# Patient Record
Sex: Female | Born: 1975 | Race: White | Hispanic: No | Marital: Married | Smoking: Never smoker
Health system: Southern US, Community
[De-identification: ages and names within clinical notes are randomized; demographics above are authoritative.]

## PROBLEM LIST (undated history)

## (undated) DIAGNOSIS — N912 Amenorrhea, unspecified: Secondary | ICD-10-CM

## (undated) HISTORY — DX: Amenorrhea, unspecified: N91.2

---

## 2006-12-14 HISTORY — PX: DILATION AND CURETTAGE OF UTERUS: SHX78

## 2013-11-02 DIAGNOSIS — F909 Attention-deficit hyperactivity disorder, unspecified type: Secondary | ICD-10-CM | POA: Insufficient documentation

## 2017-10-05 DIAGNOSIS — Z23 Encounter for immunization: Secondary | ICD-10-CM | POA: Diagnosis not present

## 2017-11-26 ENCOUNTER — Ambulatory Visit: Payer: Self-pay

## 2017-11-26 ENCOUNTER — Ambulatory Visit (INDEPENDENT_AMBULATORY_CARE_PROVIDER_SITE_OTHER): Payer: 59 | Admitting: Sports Medicine

## 2017-11-26 ENCOUNTER — Encounter: Payer: Self-pay | Admitting: Sports Medicine

## 2017-11-26 VITALS — BP 118/78 | HR 83 | Ht 64.0 in | Wt 116.8 lb

## 2017-11-26 DIAGNOSIS — M75101 Unspecified rotator cuff tear or rupture of right shoulder, not specified as traumatic: Secondary | ICD-10-CM

## 2017-11-26 DIAGNOSIS — M25511 Pain in right shoulder: Secondary | ICD-10-CM | POA: Diagnosis not present

## 2017-11-26 MED ORDER — NITROGLYCERIN 0.2 MG/HR TD PT24: MEDICATED_PATCH | TRANSDERMAL | 1 refills | Status: DC

## 2017-11-26 NOTE — Procedures (Signed)
PROCEDURE NOTE: THERAPEUTIC EXERCISES (97110) 15 minutes spent for Therapeutic exercises as below and as referenced in the AVS. This included exercises focusing on stretching, strengthening, with significant focus on eccentric aspects.  Proper technique shown and discussed handout in great detail with ATC. All questions were discussed and answered.   Long term goals include an improvement in range of motion, strength, endurance as well as avoiding reinjury. Frequency of visits is one time as determined during today's  office visit. Frequency of exercises to be performed is as per handout.  EXERCISES REVIEWED:  Scapular stabilization  Intrinsic rotator cuff strengthening  bicep curls

## 2017-11-26 NOTE — Progress Notes (Signed)
Veverly FellsMichael D. Delorise Shinerigby, DO  [place] [facility] [facility] [phone]  Blima LedgerGenelle Kidney - 41 y.o. female MRN [id number]  Date of birth: 18-Jun-1976  Visit Date: 11/26/2017  PCP: No primary care provider on file.   Referred by: No ref. provider found   Scribe for today's visit: Christoper FabianMolly Weber, ATC    SUBJECTIVE:  Angelica Lee is here for New Patient (Initial Visit) (R shoulder pain) .   Her R shoulder pain symptoms INITIALLY: Began 4-5 months ago when she was lifting her work bag from the front seat and trying to move it to the back seat. Described as a stabbing 8/10 at it's worst and an aching 1/10 at rest. , nonradiating Worsened with throwing motions and R shoulder aBd and ER Improved: nothing has really improved her symptoms Additional associated symptoms include: no radiating pain into R arm and no N/T    At this time symptoms are worsening compared to onset. She has been using ice and has taken Advil intermittently.   ROS Denies night time disturbances. Denies fevers, chills, or night sweats. Denies unexplained weight loss. Denies personal history of cancer. Denies changes in bowel or bladder habits. Denies recent unreported falls. Denies new or worsening dyspnea or wheezing. Denies headaches or dizziness.  Reports numbness, tingling or weakness  In the extremities. (B index fingers and sometimes big toes) Denies dizziness or presyncopal episodes Denies lower extremity edema     HISTORY & PERTINENT PRIOR DATA:  Prior History reviewed and updated per electronic medical record.  Significant history, findings, studies and interim changes include:  has no tobacco history on file. No results for input(s): HGBA1C, LABURIC, CREATINE in the last 8760 hours. No specialty comments available. Problem  Rotator Cuff Syndrome of Right Shoulder     OBJECTIVE:  VS:  HT:5\' 4"  (162.6 cm)   WT:116 lb 12.8 oz (53 kg)  BMI:20.04    BP:118/78  HR:83bpm   TEMP: ( )  RESP:99 %  PHYSICAL EXAM: Constitutional: WDWN, Non-toxic appearing. Psychiatric: Alert & appropriately interactive. Not depressed or anxious appearing. Respiratory: No increased work of breathing. Trachea Midline Eyes: Pupils are equal. EOM intact without nystagmus. No scleral icterus Cardiovascular:  Peripheral Pulses: peripheral pulses symmetrical No clubbing or cyanosis appreciated Capillary Refill is normal, less than 2 seconds No signficant generalized edema/anasarca Sensory Exam: intact to light touch  Right Shoulder Exam: Normal alignment & Contours Skin: No overlying erythema/ecchymosis Neck: normal range of motion and supple Axial loading and circumduction produces: No pain, very mild crepitation Non tender to palpation over: Bony Landmarks TTP over: none Drop arm test: negative Hawkins: positive, mild pain  Neers: normal, no pain   Internal Rotation:  ROM: Normal with no pain Strength: Normal External Rotation:  ROM: Normal with no pain Strength: Normal  Empty can: positive, mild pain Strength: 4/5 Speeds: normal, no pain Strength: Normal O'Briens: normal, no pain Strength: Normal   ASSESSMENT & PLAN:   1. Acute pain of right shoulder   2. Rotator cuff syndrome of right shoulder    PLAN:   Rotator cuff syndrome of right shoulder Fairly classic mechanism for supraspinatus tendinopathy.  Chronically thickened on ultrasound Therapeutic exercises reviewed per procedure note Avoidance of exacerbating activities recommended Nitroglycerin protocol reviewed in detail.  If any lack of improvement can consider subacromial injection but given the overall stability and minimal symptoms when comparing nitroglycerin to corticosteroids 1 week pain relief is the same and promoting healing is preferable.  No  contraindications, cautioned on skin and headache issues.  Repeat MSK ultrasound at 6 weeks   ++++++++++++++++++++++++++++++++++++++++++++ Orders &  Meds: Orders Placed This Encounter  Procedures  . Misc procedure  . US LIMITED JOINT SPACE STRUCTURES UP RIGHT(NO LINKED CHARGES)    Meds ordered this encounter  Medications  . nitroGLYCERIN (NITRODUR - DOSED IN MG/24 HR) 0.2 mg/hr patch    Sig: Place 1/4 to 1/2 of a patch over affected region. Remove and replace once daily.  Slightly alter skin placement daily    Dispense:  30 patch    Refill:  1    For musculoskeletal purposes.  Okay to cut patch.    ++++++++++++++++++++++++++++++++++++++++++++ Follow-up: No Follow-up on file.   Pertinent documentation may be included in additional procedure notes, imaging studies, problem based documentation and patient instructions. Please see these sections of the encounter for additional information regarding this visit. CMA/ATC served as Neurosurgeonscribe during this visit. History, Physical, and Plan performed by medical provider. Documentation and orders reviewed and attested to.      Andrena MewsMichael D Daymion Nazaire, DO    [place] [facility] Physician

## 2017-11-26 NOTE — Assessment & Plan Note (Signed)
Fairly classic mechanism for supraspinatus tendinopathy.  Chronically thickened on ultrasound Therapeutic exercises reviewed per procedure note Avoidance of exacerbating activities recommended Nitroglycerin protocol reviewed in detail.  If any lack of improvement can consider subacromial injection but given the overall stability and minimal symptoms when comparing nitroglycerin to corticosteroids 1 week pain relief is the same and promoting healing is preferable.  No contraindications, cautioned on skin and headache issues.  Repeat MSK ultrasound at 6 weeks

## 2017-11-26 NOTE — Procedures (Signed)
LIMITED MSK ULTRASOUND OF right shoulder Images were obtained and interpreted by myself, Gaspar BiddingMichael Taylen Osorto, DO  Images have been saved and stored to PACS system. Images obtained on: GE S7 Ultrasound machine  FINDINGS:  Biceps Tendon: Intact with a small halo sign Pec Major Insertion: Normal Subscapularis Tendon: Normal Supraspinatus Tendon: Chronically thickened interstitial splitting with mild surrounding neovascularity but this is minimal.  Diminutive bursal fluid Infraspinatus/Teres Minor Tendon: Normal AC Joint: Normal JOINT: No significant GH spurring appreciated  IMPRESSION:  1. Supraspinatus tendinopathy without overt tearing

## 2017-11-26 NOTE — Patient Instructions (Addendum)
Please perform the exercise program that we have prepared for you and gone over in detail on a daily basis.  In addition to the handout you were provided you can access your program through: [email or web address]   Your unique program code is: [email or web address]   Nitroglycerin Protocol   Apply 1/4 nitroglycerin patch to affected area daily.  Change position of patch within the affected area every 24 hours.  You may experience a headache during the first 1-2 weeks of using the patch, these should subside.  If you experience headaches after beginning nitroglycerin patch treatment, you may take your preferred over the counter pain reliever.  Another side effect of the nitroglycerin patch is skin irritation or rash related to patch adhesive.  Please notify our office if you develop more severe headaches or rash, and stop the patch.  Tendon healing with nitroglycerin patch may require 12 to 24 weeks depending on the extent of injury.  Men should not use if taking Viagra, Cialis, or Levitra.   Do not use if you have migraines or rosacea.

## 2018-01-07 ENCOUNTER — Encounter: Payer: Self-pay | Admitting: Sports Medicine

## 2018-01-07 ENCOUNTER — Ambulatory Visit: Payer: Self-pay

## 2018-01-07 ENCOUNTER — Ambulatory Visit (INDEPENDENT_AMBULATORY_CARE_PROVIDER_SITE_OTHER): Payer: 59 | Admitting: Sports Medicine

## 2018-01-07 VITALS — BP 112/68 | HR 74 | Ht 64.0 in | Wt 119.6 lb

## 2018-01-07 DIAGNOSIS — M75101 Unspecified rotator cuff tear or rupture of right shoulder, not specified as traumatic: Secondary | ICD-10-CM

## 2018-01-07 DIAGNOSIS — M9902 Segmental and somatic dysfunction of thoracic region: Secondary | ICD-10-CM | POA: Diagnosis not present

## 2018-01-07 DIAGNOSIS — M9901 Segmental and somatic dysfunction of cervical region: Secondary | ICD-10-CM

## 2018-01-07 NOTE — Procedures (Signed)
LIMITED MSK ULTRASOUND OF right shoulder Images were obtained and interpreted by myself, Gaspar BiddingMichael Celisa Schoenberg, DO  Images have been saved and stored to PACS system. Images obtained on: GE S7 Ultrasound machine  FINDINGS:  Biceps Tendon: Normal Pec Major Insertion: Normal Subscapularis Tendon: Small amount of interstitial splitting but minimal. Supraspinatus Tendon: Slightly diminutive given overlying deltoid.  She has interstitial splitting with minimal neovascularity. Infraspinatus/Teres Minor Tendon: Normal AC Joint: Normal JOINT: No significant GH spurring appreciated  LABRUM: Not evaluated    IMPRESSION:  1. Supraspinatus rotator cuff tendinopathy without overt tearing.  Minimal neovascularity.

## 2018-01-07 NOTE — Progress Notes (Signed)
Angelica FellsMichael D. Angelica Shinerigby, DO  Angelica Lee [phone]  Angelica LedgerGenelle Lee - 42 y.o. female MRN [id number]  Date of birth: [birth year]  Visit Date: 01/07/2018  PCP: Angelica Lee, Angelica Lee D, DO   Referred by: No ref. provider found   Scribe for today's visit: Angelica ClinchBrandy Lee, CMA     SUBJECTIVE:  Angelica Lee is here for Follow-up (RT shoulder pain)  Her R shoulder pain symptoms INITIALLY: Began 4-5 months ago when she was lifting her work bag from the front seat and trying to move it to the back seat. Described as a stabbing 8/10 at it's worst and an aching 1/10 at rest. , nonradiating Worsened with throwing motions and R shoulder aBd and ER Improved: nothing has really improved her symptoms Additional associated symptoms include: no radiating pain into R arm and no N/T     Compared to the last office visit, her previously described symptoms are improving, over the past few days the pain has not been waking her from sleep.  Current symptoms are mild at rest but moderate when she "pushes it".  Pain is non-radiating. She has been following Nitro Protocol, and doing home exercises. Prior she had been taking Advil and icing shoulder prn.    ROS Denies night time disturbances. Denies fevers, chills, or night sweats. Denies unexplained weight loss. Denies personal history of cancer. Denies changes in bowel or bladder habits. Denies recent unreported falls. Denies new or worsening dyspnea or wheezing. Denies headaches or dizziness.  Reports numbness, tingling or weakness  In the extremities.  Denies dizziness or presyncopal episodes Denies lower extremity edema     HISTORY & PERTINENT PRIOR DATA:  Prior History reviewed and updated per electronic medical record.  Significant history, findings, studies and interim changes include:  reports that  has never smoked. she has never used smokeless tobacco. No results for input(s): HGBA1C, LABURIC, CREATINE  in the last 8760 hours. No specialty comments available. Problem  Rotator Cuff Syndrome of Right Shoulder    OBJECTIVE:  VS:  HT:5\' 4"  (162.6 cm)   WT:119 lb 9.6 oz (54.3 kg)  BMI:20.52    BP:112/68  HR:74bpm  TEMP: ( )  RESP:99 %   PHYSICAL EXAM: Constitutional: WDWN, Non-toxic appearing. Psychiatric: Alert & appropriately interactive.  Not depressed or anxious appearing. Respiratory: No increased work of breathing.  Trachea Midline Eyes: Pupils are equal.  EOM intact without nystagmus.  No scleral icterus  NEUROVASCULAR exam: No clubbing or cyanosis appreciated No significant venous stasis changes Capillary Refill: normal, less than 2 seconds   Right Shoulder Exam: Normal alignment & Contours Skin: No overlying erythema/ecchymosis Neck: normal range of motion and supple Axial loading and circumduction produces: No pain or crepitation  Non tender to palpation over: Bony Landmarks TTP over: clavicle Drop arm test: negative Hawkins: normal, no pain  Neers: normal, no pain   Internal Rotation:  ROM: Normal with mild pain Strength: Normal External Rotation:  ROM: Normal with no pain Strength: Normal  Empty can: positive, mild pain Strength: Normal Speeds: positive, mild pain Strength: Normal O'Briens: normal, no pain Strength: Normal    ASSESSMENT & PLAN:   1. Rotator cuff syndrome of right shoulder   2. Somatic dysfunction of cervical region   3. Somatic dysfunction of thoracic region    ++++++++++++++++++++++++++++++++++++++++++++ Orders & Meds:  Orders Placed This Encounter  Procedures  . OSTEOPATHIC MANIPULATION TREATMENT  . US LIMITED JOINT SPACE STRUCTURES UP RIGHT(NO LINKED CHARGES)  No orders of the defined types were placed in this encounter.   ++++++++++++++++++++++++++++++++++++++++++++ PLAN:    Rotator cuff syndrome of right shoulder Rotator cuff tendinopathy with underlying scapular dyskinesis and interstitial changes of the supraspinatus.   We will plan to have her begin on home therapeutic exercises and these were reviewed in detail with the athletic training staff. Therapeutic exercises per procedure note Discussed that the anticipated amount of time for healing is 12- 24 weeks for Tendinopathic changes.  Emphasized the importance of improving blood flow as well as eccentric loading of the tendon.  We will plan to check in in 6 weeks and repeat ultrasound at that time.   We did discuss that further diagnostic manipulative medicine could be beneficial and healing and chiropractic was offered versus repeat manipulation with myself.    Follow-up: Return in about 6 weeks (around 02/18/2018).   Pertinent documentation may be included in additional procedure notes, imaging studies, problem based documentation and patient instructions. Please see these sections of the encounter for additional information regarding this visit. CMA/ATC served as Neurosurgeon during this visit. History, Physical, and Plan performed by medical provider. Documentation and orders reviewed and attested to.      Angelica Mews, DO    [facility]

## 2018-01-07 NOTE — Patient Instructions (Signed)
Damien Rudolfo at [facility] is who I would recommend you seeing for your neck and upper back.  [phone]  Also check out "[employer]" which is a program developed by Dr. Myles LippsEric Goodman.   There are links to a couple of his YouTube Videos below and I would like to see performing one of his videos 5-6 days per week.    A good intro video is: "Independence from Pain 7-minute Video" - [email or web address]   His more advanced video is: "Powerful Posture and Pain Relief: 12 minutes of Foundation Training" - https://[email or web address].be/[email or web address]  Do not try to attempt this entire video when first beginning.    Try breaking of each exercise that he goes into shorter segments.  Otherwise if they perform an exercise for 45 seconds, start with 15 seconds and rest and then resume when they begin the new activity.    If you work your way up to doing this 12 minute video, I expect you will see significant improvements in your pain.  If you enjoy his videos and would like to find out more you can look on his website: [email or web address].  He has a workout streaming option as well as a DVD set available for purchase.  Amazon has the best price for his DVDs.

## 2018-02-07 ENCOUNTER — Encounter: Payer: Self-pay | Admitting: Sports Medicine

## 2018-02-08 ENCOUNTER — Encounter: Payer: Self-pay | Admitting: Sports Medicine

## 2018-02-08 NOTE — Assessment & Plan Note (Signed)
Rotator cuff tendinopathy with underlying scapular dyskinesis and interstitial changes of the supraspinatus.  We will plan to have her begin on home therapeutic exercises and these were reviewed in detail with the athletic training staff. Therapeutic exercises per procedure note Discussed that the anticipated amount of time for healing is 12- 24 weeks for Tendinopathic changes.  Emphasized the importance of improving blood flow as well as eccentric loading of the tendon.  We will plan to check in in 6 weeks and repeat ultrasound at that time.   We did discuss that further diagnostic manipulative medicine could be beneficial and healing and chiropractic was offered versus repeat manipulation with myself.

## 2018-02-08 NOTE — Procedures (Signed)
PROCEDURE NOTE: THERAPEUTIC EXERCISES (97110) 15 minutes spent for Therapeutic exercises as below and as referenced in the AVS. This included exercises focusing on stretching, strengthening, with significant focus on eccentric aspects.  Proper technique shown and discussed handout in great detail with ATC. All questions were discussed and answered.   Long term goals include an improvement in range of motion, strength, endurance as well as avoiding reinjury. Frequency of visits is one time as determined during today's  office visit. Frequency of exercises to be performed is as per handout.  EXERCISES REVIEWED:  Scapular stabilization  Intrinsic rotator cuff strengthening  

## 2018-02-08 NOTE — Procedures (Signed)
PROCEDURE NOTE : OSTEOPATHIC MANIPULATION The decision today to treat with Osteopathic Manipulative Therapy (OMT) was based on physical exam findings. Verbal consent was obtained following a discussion with the patient regarding the of risks, benefits and potential side effects, including an acute pain flare,post manipulation soreness and need for repeat treatments. Additionally, we specifically discussed the minimal risk of  injury to neurovascular structures associated with Cervical manipulation.   Contraindications to OMT reviewed and include: NONE  Manipulation was performed as below: Regions Treated: cervial spine and lumbar spine Techniques used: HVLA, muscle energy, myofascial release and Articulatory The patient tolerated the treatment well and reported Improved symptoms following treatment today. Patient was given medications, exercises, stretches and lifestyle modifications per AVS and verbally.     OSTEOPATHIC/STRUCTURAL EXAM FINDINGS:    AA rotated right  C2 FRS left  C6 FRS left  T2 through T6 neutral rotated left, side bent right

## 2018-02-18 ENCOUNTER — Encounter: Payer: Self-pay | Admitting: Sports Medicine

## 2018-02-18 ENCOUNTER — Ambulatory Visit: Payer: Self-pay

## 2018-02-18 ENCOUNTER — Ambulatory Visit (INDEPENDENT_AMBULATORY_CARE_PROVIDER_SITE_OTHER): Payer: 59 | Admitting: Sports Medicine

## 2018-02-18 VITALS — BP 108/70 | HR 91 | Ht 64.0 in | Wt 118.2 lb

## 2018-02-18 DIAGNOSIS — M9902 Segmental and somatic dysfunction of thoracic region: Secondary | ICD-10-CM

## 2018-02-18 DIAGNOSIS — M75101 Unspecified rotator cuff tear or rupture of right shoulder, not specified as traumatic: Secondary | ICD-10-CM

## 2018-02-18 DIAGNOSIS — M9901 Segmental and somatic dysfunction of cervical region: Secondary | ICD-10-CM

## 2018-02-18 NOTE — Patient Instructions (Addendum)
Reminder to discuss piezowave therapy w/ Dr. Sherryle Lisodulfo.

## 2018-02-18 NOTE — Progress Notes (Signed)
Angelica Lee. [facility] [phone]  Angelica Lee - 42 y.o. female MRN [id number]  Date of birth: Sep 30, 1976  Visit Date: 02/18/2018  PCP: Andrena Mews, DO   Referred by: Andrena Mews, DO   Scribe for today's visit: Stevenson Clinch, CMA     SUBJECTIVE:  Angelica Lee is here for Follow-up (neck and R shoulder pain)  11/26/17: Her R shoulder pain symptoms INITIALLY: Began 4-5 months ago when she was lifting her work bag from the front seat and trying to move it to the back seat. Described as a stabbing 8/10 at it's worst and an aching 1/10 at rest. , nonradiating Worsened with throwing motions and R shoulder aBd and ER Improved: nothing has really improved her symptoms Additional associated symptoms include: no radiating pain into R arm and no N/T At this time symptoms are worsening compared to onset. She has been using ice and has taken Advil intermittently.  02/18/18: Compared to the last office visit, her previously described symptoms are improving, shoulder pain has improved but she continues to have neck pain.  Current symptoms are mild & are nonradiating She has been following Nitro Protocol with no side effects, doing HEP and goodman exercises occasionally with no trouble. She has done OMT in the past and was referred to Dr. Lambert Mody at [facility].    ROS Denies night time disturbances. Denies fevers, chills, or night sweats. Denies unexplained weight loss. Denies personal history of cancer. Denies changes in bowel or bladder habits. Denies recent unreported falls. Denies new or worsening dyspnea or wheezing. Reports headaches.  Denies numbness, tingling or weakness  In the extremities.  Denies dizziness or presyncopal episodes Denies lower extremity edema   .bsm1history  OBJECTIVE:  VS:  HT:5\' 4"  (162.6 cm)   WT:118 lb 3.2 oz (53.6 kg)  BMI:20.28    BP:108/70  HR:91bpm  TEMP: ( )  RESP:99  %   PHYSICAL EXAM: Constitutional: WDWN, Non-toxic appearing. Psychiatric: Alert & appropriately interactive.  Not depressed or anxious appearing. Respiratory: No increased work of breathing.  Trachea Midline Eyes: Pupils are equal.  EOM intact without nystagmus.  No scleral icterus  NEUROVASCULAR exam: No clubbing or cyanosis appreciated No significant venous stasis changes Capillary Refill: normal, less than 2 seconds   Right shoulder with full overhead range of motion.  She has an elevated right scapula.  Slightly protracted.  Small amount of pain with cross body testing but this is minimal.  Otherwise rotator cuff strength is intact with no significant pain.  Negative speeds test negative O'Brien's test.   ASSESSMENT & PLAN:   1. Rotator cuff syndrome of right shoulder   2. Somatic dysfunction of cervical region   3. Somatic dysfunction of thoracic region    ++++++++++++++++++++++++++++++++++++++++++++ Orders & Meds:  Orders Placed This Encounter  Procedures  . [place] MSK POCT ULTRASOUND   No orders of the defined types were placed in this encounter.   ++++++++++++++++++++++++++++++++++++++++++++ PLAN:    Rotator cuff syndrome of right shoulder She is doing significantly better.  I did reemphasize the importance of continuing with home therapeutic exercises.  She does have continued scapular thoracic dysfunction and would benefit from ongoing exercises as well as ongoing structural evaluation.  Given the small amount of persistent change within the supraspinatus tendon Piezowave Therapy could be considered with chiropractic treatments.  Repeat osteopathic manipulation performed today.  Continue with topical nitroglycerin until she is completed the current  prescription but if symptoms have resolved by that time no further refills needed.  If any small return of symptoms can refill this however if needed.   Follow-up: Return if symptoms worsen or fail to improve.   Pertinent  documentation may be included in additional procedure notes, imaging studies, problem based documentation and patient instructions. Please see these sections of the encounter for additional information regarding this visit. CMA/ATC served as Neurosurgeonscribe during this visit. History, Physical, and Plan performed by medical provider. Documentation and orders reviewed and attested to.      Andrena MewsMichael D Rigby, DO    [place] [facility] Physician

## 2018-02-18 NOTE — Procedures (Signed)
PROCEDURE NOTE : OSTEOPATHIC MANIPULATION The decision today to treat with Osteopathic Manipulative Therapy (OMT) was based on physical exam findings. Verbal consent was obtained following a discussion with the patient regarding the of risks, benefits and potential side effects, including an acute pain flare,post manipulation soreness and need for repeat treatments. Additionally, we specifically discussed the minimal risk of  injury to neurovascular structures associated with Cervical manipulation.   Contraindications to OMT reviewed and include: NONE  Manipulation was performed as below: Regions Treated: cervial spine, thoracic spine and Ribs Techniques used: HVLA, muscle energy, myofascial release and Articulatory   The patient tolerated the treatment well and reported Improved symptoms following treatment today. Patient was given medications, exercises, stretches and lifestyle modifications per AVS and verbally.     OSTEOPATHIC/STRUCTURAL EXAM FINDINGS:   Sup. Scapula:  right Restricted Shoulder:  right  SOMATIC DYSFUNCTION         Cervical: C2, Flexion, Side bend: right and Rotation: right  C4, Flexion, Side bend: left and Rotation: left  C6, Extension, Side bend: left and Rotation: left        Thoracic: T2, Flexion, Side bend: right and Rotation: right

## 2018-02-20 NOTE — Assessment & Plan Note (Signed)
She is doing significantly better.  I did reemphasize the importance of continuing with home therapeutic exercises.  She does have continued scapular thoracic dysfunction and would benefit from ongoing exercises as well as ongoing structural evaluation.  Given the small amount of persistent change within the supraspinatus tendon Piezowave Therapy could be considered with chiropractic treatments.  Repeat osteopathic manipulation performed today.  Continue with topical nitroglycerin until she is completed the current prescription but if symptoms have resolved by that time no further refills needed.  If any small return of symptoms can refill this however if needed.

## 2018-02-20 NOTE — Procedures (Signed)
LIMITED MSK ULTRASOUND OF RIGHT SHOULDER Images were obtained and interpreted by myself, Gaspar BiddingMichael Rigby, DO  Images have been saved and stored to PACS system. Images obtained on: GE S7 Ultrasound machine  FINDINGS:  Biceps Tendon: Normal Pec Major Insertion: Normal Subscapularis Tendon: Normal Supraspinatus Tendon: Slight interstitial splitting however significant improvement in the interstitial fibrillar structure.  There is mild increased neovascularity as expected.  Small subdeltoid bursa but minimal.  No overt tearing appreciated. Infraspinatus/Teres Minor Tendon: Normal AC Joint: Normal JOINT: No significant GH spurring appreciated  LABRUM: Not evaluated   IMPRESSION:  1. Improving supraspinatus tendinopathy

## 2018-02-22 DIAGNOSIS — M9901 Segmental and somatic dysfunction of cervical region: Secondary | ICD-10-CM | POA: Diagnosis not present

## 2018-02-22 DIAGNOSIS — M9902 Segmental and somatic dysfunction of thoracic region: Secondary | ICD-10-CM | POA: Diagnosis not present

## 2018-02-22 DIAGNOSIS — M9903 Segmental and somatic dysfunction of lumbar region: Secondary | ICD-10-CM | POA: Diagnosis not present

## 2018-02-24 DIAGNOSIS — M9902 Segmental and somatic dysfunction of thoracic region: Secondary | ICD-10-CM | POA: Diagnosis not present

## 2018-02-24 DIAGNOSIS — M9903 Segmental and somatic dysfunction of lumbar region: Secondary | ICD-10-CM | POA: Diagnosis not present

## 2018-02-24 DIAGNOSIS — M9901 Segmental and somatic dysfunction of cervical region: Secondary | ICD-10-CM | POA: Diagnosis not present

## 2018-03-01 DIAGNOSIS — M9903 Segmental and somatic dysfunction of lumbar region: Secondary | ICD-10-CM | POA: Diagnosis not present

## 2018-03-01 DIAGNOSIS — M9902 Segmental and somatic dysfunction of thoracic region: Secondary | ICD-10-CM | POA: Diagnosis not present

## 2018-03-01 DIAGNOSIS — M9901 Segmental and somatic dysfunction of cervical region: Secondary | ICD-10-CM | POA: Diagnosis not present

## 2018-03-04 DIAGNOSIS — M9903 Segmental and somatic dysfunction of lumbar region: Secondary | ICD-10-CM | POA: Diagnosis not present

## 2018-03-04 DIAGNOSIS — M9901 Segmental and somatic dysfunction of cervical region: Secondary | ICD-10-CM | POA: Diagnosis not present

## 2018-03-04 DIAGNOSIS — M9902 Segmental and somatic dysfunction of thoracic region: Secondary | ICD-10-CM | POA: Diagnosis not present

## 2018-03-08 DIAGNOSIS — M9902 Segmental and somatic dysfunction of thoracic region: Secondary | ICD-10-CM | POA: Diagnosis not present

## 2018-03-08 DIAGNOSIS — M9903 Segmental and somatic dysfunction of lumbar region: Secondary | ICD-10-CM | POA: Diagnosis not present

## 2018-03-08 DIAGNOSIS — M9901 Segmental and somatic dysfunction of cervical region: Secondary | ICD-10-CM | POA: Diagnosis not present

## 2018-03-11 DIAGNOSIS — M9901 Segmental and somatic dysfunction of cervical region: Secondary | ICD-10-CM | POA: Diagnosis not present

## 2018-03-11 DIAGNOSIS — M9902 Segmental and somatic dysfunction of thoracic region: Secondary | ICD-10-CM | POA: Diagnosis not present

## 2018-03-11 DIAGNOSIS — M9903 Segmental and somatic dysfunction of lumbar region: Secondary | ICD-10-CM | POA: Diagnosis not present

## 2018-03-15 DIAGNOSIS — M9903 Segmental and somatic dysfunction of lumbar region: Secondary | ICD-10-CM | POA: Diagnosis not present

## 2018-03-15 DIAGNOSIS — M9902 Segmental and somatic dysfunction of thoracic region: Secondary | ICD-10-CM | POA: Diagnosis not present

## 2018-03-15 DIAGNOSIS — M9901 Segmental and somatic dysfunction of cervical region: Secondary | ICD-10-CM | POA: Diagnosis not present

## 2018-03-29 DIAGNOSIS — M9901 Segmental and somatic dysfunction of cervical region: Secondary | ICD-10-CM | POA: Diagnosis not present

## 2018-03-29 DIAGNOSIS — M9903 Segmental and somatic dysfunction of lumbar region: Secondary | ICD-10-CM | POA: Diagnosis not present

## 2018-03-29 DIAGNOSIS — M9902 Segmental and somatic dysfunction of thoracic region: Secondary | ICD-10-CM | POA: Diagnosis not present

## 2018-04-05 DIAGNOSIS — M9902 Segmental and somatic dysfunction of thoracic region: Secondary | ICD-10-CM | POA: Diagnosis not present

## 2018-04-05 DIAGNOSIS — M9901 Segmental and somatic dysfunction of cervical region: Secondary | ICD-10-CM | POA: Diagnosis not present

## 2018-04-05 DIAGNOSIS — M9903 Segmental and somatic dysfunction of lumbar region: Secondary | ICD-10-CM | POA: Diagnosis not present

## 2018-04-14 DIAGNOSIS — M9902 Segmental and somatic dysfunction of thoracic region: Secondary | ICD-10-CM | POA: Diagnosis not present

## 2018-04-14 DIAGNOSIS — M9901 Segmental and somatic dysfunction of cervical region: Secondary | ICD-10-CM | POA: Diagnosis not present

## 2018-04-14 DIAGNOSIS — M9903 Segmental and somatic dysfunction of lumbar region: Secondary | ICD-10-CM | POA: Diagnosis not present

## 2018-04-28 DIAGNOSIS — M9901 Segmental and somatic dysfunction of cervical region: Secondary | ICD-10-CM | POA: Diagnosis not present

## 2018-04-28 DIAGNOSIS — M9902 Segmental and somatic dysfunction of thoracic region: Secondary | ICD-10-CM | POA: Diagnosis not present

## 2018-04-28 DIAGNOSIS — M9903 Segmental and somatic dysfunction of lumbar region: Secondary | ICD-10-CM | POA: Diagnosis not present

## 2018-05-19 DIAGNOSIS — M9903 Segmental and somatic dysfunction of lumbar region: Secondary | ICD-10-CM | POA: Diagnosis not present

## 2018-05-19 DIAGNOSIS — M9902 Segmental and somatic dysfunction of thoracic region: Secondary | ICD-10-CM | POA: Diagnosis not present

## 2018-05-19 DIAGNOSIS — M9901 Segmental and somatic dysfunction of cervical region: Secondary | ICD-10-CM | POA: Diagnosis not present

## 2019-03-13 ENCOUNTER — Telehealth: Payer: Self-pay | Admitting: Family Medicine

## 2019-03-13 NOTE — Telephone Encounter (Signed)
Copied from CRM [id number]. Topic: Quick Communication - See Telephone Encounter >> Mar 13, 2019  4:57 PM Jens Som A wrote: CRM for notification. See Telephone encounter for: 03/13/19. Patient is calling back to reschedule new patient  appt with Dr. Earlene Plater [phone] [facility])  Email verified. Please advise.

## 2019-03-14 NOTE — Telephone Encounter (Signed)
Contact patient to schedule °

## 2019-03-14 NOTE — Telephone Encounter (Signed)
Sent to wrong office. Routing to [facility]

## 2019-03-15 ENCOUNTER — Encounter: Payer: Self-pay | Admitting: Family Medicine

## 2019-03-15 ENCOUNTER — Ambulatory Visit (INDEPENDENT_AMBULATORY_CARE_PROVIDER_SITE_OTHER): Payer: 59 | Admitting: Family Medicine

## 2019-03-15 ENCOUNTER — Other Ambulatory Visit: Payer: Self-pay

## 2019-03-15 VITALS — Temp 98.3°F | Ht 63.0 in | Wt 118.0 lb

## 2019-03-15 DIAGNOSIS — Z789 Other specified health status: Secondary | ICD-10-CM | POA: Diagnosis not present

## 2019-03-15 DIAGNOSIS — J302 Other seasonal allergic rhinitis: Secondary | ICD-10-CM | POA: Diagnosis not present

## 2019-03-15 DIAGNOSIS — E559 Vitamin D deficiency, unspecified: Secondary | ICD-10-CM | POA: Diagnosis not present

## 2019-03-15 DIAGNOSIS — F902 Attention-deficit hyperactivity disorder, combined type: Secondary | ICD-10-CM | POA: Diagnosis not present

## 2019-03-15 NOTE — Progress Notes (Signed)
Virtual Visit via Video   I connected with Angelica Lee on 03/17/19 at  2:20 PM EDT by a video enabled telemedicine application and verified that I am speaking with the correct person using two identifiers. Location patient: Home Location provider: [facility], Office Persons participating in the virtual visit: Angelica Lee, Helane Rima, DO Barnie Mort, CMA acting as scribe for Dr. Helane Rima.   I discussed the limitations of evaluation and management by telemedicine and the availability of in person appointments. The patient expressed understanding and agreed to proceed.  Subjective:   HPI:  ADHD: Patient has been treated from elementary school. She has tried several different medications. She like Vyvanse better but when her insurance changed and her co pay went up. She is currently on Adderall xr 20mg . She feels like it is working well and would like to stay on that.   She is working on diet and getting out side more. She is working from home and able to be outside more with kids so she is getting more exercise.     Reviewed all precautions and expectations with prevention of Covid-19 ROS: See pertinent positives and negatives per HPI.  Patient Active Problem List   Diagnosis Date Noted  . Rotator cuff syndrome of right shoulder 11/26/2017  . ADHD (attention deficit hyperactivity disorder) 11/02/2013    Social History   Tobacco Use  . Smoking status: Never Smoker  . Smokeless tobacco: Never Used  Substance Use Topics  . Alcohol use: Yes    Comment: 4-5 days a week glass of wine a night     Current Outpatient Medications:  .  amphetamine-dextroamphetamine (ADDERALL XR) 20 MG 24 hr capsule, Take 1 capsule (20 mg total) by mouth daily., Disp: 30 capsule, Rfl: 0 .  cetirizine (ZYRTEC) 10 MG tablet, Take 10 mg by mouth daily., Disp: , Rfl:  .  cholecalciferol (VITAMIN D3) 25 MCG (1000 UT) tablet, Take 1,000 Units by mouth daily., Disp: , Rfl:  .  norethindrone-ethinyl  estradiol (CYCLAFEM,ALYACEN) 0.5/0.75/1-35 MG-MCG tablet, Take 1 tablet by mouth daily., Disp: 3 Package, Rfl: 3 .  [START ON 04/14/2019] amphetamine-dextroamphetamine (ADDERALL XR) 20 MG 24 hr capsule, Take 1 capsule (20 mg total) by mouth daily., Disp: 30 capsule, Rfl: 0 .  [START ON 05/14/2019] amphetamine-dextroamphetamine (ADDERALL XR) 20 MG 24 hr capsule, Take 1 capsule (20 mg total) by mouth daily., Disp: 30 capsule, Rfl: 0  No Known Allergies  Objective:   VITALS: Per patient if applicable, see vitals. GENERAL: Alert, appears well and in no acute distress. HEENT: Atraumatic, conjunctiva clear, no obvious abnormalities on inspection of external nose and ears. NECK: Normal movements of the head and neck. CARDIOPULMONARY: No increased WOB. Speaking in clear sentences. I:E ratio WNL.  MS: Moves all visible extremities without noticeable abnormality. PSYCH: Pleasant and cooperative, well-groomed. Speech normal rate and rhythm. Affect is appropriate. Insight and judgement are appropriate. Attention is focused, linear, and appropriate.  NEURO: CN grossly intact. Oriented as arrived to appointment on time with no prompting. Moves both UE equally.  SKIN: No obvious lesions, wounds, erythema, or cyanosis noted on face or hands.  Assessment and Plan:   [name] was seen today for establish care.  Diagnoses and all orders for this visit:  Attention deficit hyperactivity disorder (ADHD), combined type -     amphetamine-dextroamphetamine (ADDERALL XR) 20 MG 24 hr capsule; Take 1 capsule (20 mg total) by mouth daily. -     amphetamine-dextroamphetamine (ADDERALL XR) 20 MG 24  hr capsule; Take 1 capsule (20 mg total) by mouth daily. -     amphetamine-dextroamphetamine (ADDERALL XR) 20 MG 24 hr capsule; Take 1 capsule (20 mg total) by mouth daily.  Seasonal allergies  Uses birth control -     norethindrone-ethinyl estradiol (CYCLAFEM,ALYACEN) 0.5/0.75/1-35 MG-MCG tablet; Take 1 tablet by mouth  daily.  Vitamin D deficiency    . Reviewed expectations re: course of current medical issues. . Discussed self-management of symptoms. . Outlined signs and symptoms indicating need for more acute intervention. . Patient verbalized understanding and all questions were answered. Marland Kitchen Health Maintenance issues including appropriate healthy diet, exercise, and smoking avoidance were discussed with patient. . See orders for this visit as documented in the electronic medical record.  Helane Rima, DO 03/17/2019

## 2019-03-16 ENCOUNTER — Encounter: Payer: Self-pay | Admitting: Family Medicine

## 2019-03-17 ENCOUNTER — Encounter: Payer: Self-pay | Admitting: Family Medicine

## 2019-03-17 MED ORDER — AMPHETAMINE-DEXTROAMPHET ER 20 MG PO CP24: 20.0000 mg | ORAL_CAPSULE | Freq: Every day | ORAL | 0 refills | Status: DC

## 2019-03-17 MED ORDER — NORETHIN-ETH ESTRAD TRIPHASIC 0.5/0.75/1-35 MG-MCG PO TABS: 1.0000 | ORAL_TABLET | Freq: Every day | ORAL | 3 refills | Status: DC

## 2019-03-17 NOTE — Telephone Encounter (Signed)
Printed and given to reception to update.

## 2019-06-07 ENCOUNTER — Ambulatory Visit: Payer: 59 | Admitting: Family Medicine

## 2019-06-27 ENCOUNTER — Other Ambulatory Visit: Payer: Self-pay | Admitting: Family Medicine

## 2019-06-27 DIAGNOSIS — F902 Attention-deficit hyperactivity disorder, combined type: Secondary | ICD-10-CM

## 2019-06-27 MED ORDER — AMPHETAMINE-DEXTROAMPHET ER 20 MG PO CP24: 20.0000 mg | ORAL_CAPSULE | Freq: Every day | ORAL | 0 refills | Status: DC

## 2019-06-27 NOTE — Telephone Encounter (Signed)
Rx request Last fill 04/0/20  #30/0 Last OV 03/15/19

## 2019-08-07 ENCOUNTER — Other Ambulatory Visit: Payer: Self-pay | Admitting: Family Medicine

## 2019-08-07 DIAGNOSIS — F902 Attention-deficit hyperactivity disorder, combined type: Secondary | ICD-10-CM

## 2019-08-07 MED ORDER — AMPHETAMINE-DEXTROAMPHET ER 20 MG PO CP24: 20.0000 mg | ORAL_CAPSULE | Freq: Every day | ORAL | 0 refills | Status: DC

## 2019-09-08 ENCOUNTER — Other Ambulatory Visit: Payer: Self-pay | Admitting: Family Medicine

## 2019-09-08 DIAGNOSIS — F902 Attention-deficit hyperactivity disorder, combined type: Secondary | ICD-10-CM

## 2019-09-08 MED ORDER — AMPHETAMINE-DEXTROAMPHET ER 20 MG PO CP24: 20.0000 mg | ORAL_CAPSULE | Freq: Every day | ORAL | 0 refills | Status: DC

## 2019-09-08 NOTE — Telephone Encounter (Signed)
Last OV 03/15/19 Last refill 08/07/19 #30/0 Next OV 09/18/19  Forwarding to Dr. Juleen China

## 2019-09-14 ENCOUNTER — Encounter: Payer: Self-pay | Admitting: Family Medicine

## 2019-09-17 NOTE — Progress Notes (Signed)
Subjective:    Angelica Lee is a 43 y.o. female and is here for a comprehensive physical exam.  There are no preventive care reminders to display for this patient.   Current Outpatient Medications:  .  amphetamine-dextroamphetamine (ADDERALL XR) 20 MG 24 hr capsule, Take 1 capsule (20 mg total) by mouth daily., Disp: 30 capsule, Rfl: 0 .  amphetamine-dextroamphetamine (ADDERALL XR) 20 MG 24 hr capsule, Take 1 capsule (20 mg total) by mouth daily., Disp: 30 capsule, Rfl: 0 .  amphetamine-dextroamphetamine (ADDERALL XR) 20 MG 24 hr capsule, Take 1 capsule (20 mg total) by mouth daily., Disp: 30 capsule, Rfl: 0 .  cetirizine (ZYRTEC) 10 MG tablet, Take 10 mg by mouth daily., Disp: , Rfl:  .  cholecalciferol (VITAMIN D3) 25 MCG (1000 UT) tablet, Take 1,000 Units by mouth daily., Disp: , Rfl:  .  fluticasone (FLONASE) 50 MCG/ACT nasal spray, Place 2 sprays into both nostrils daily., Disp: , Rfl:  .  norethindrone-ethinyl estradiol (CYCLAFEM,ALYACEN) 0.5/0.75/1-35 MG-MCG tablet, Take 1 tablet by mouth daily., Disp: 3 Package, Rfl: 3  PMHx, SurgHx, SocialHx, Medications, and Allergies were reviewed in the Visit Navigator and updated as appropriate.   History reviewed. No pertinent past medical history.   Past Surgical History:  Procedure Laterality Date  . DILATION AND CURETTAGE OF UTERUS  2008     Family History  Problem Relation Age of Onset  . Hypertension Mother   . Hyperlipidemia Mother   . Hypertension Father   . ADD / ADHD Father   . Diabetes Sister        she is twin   . Breast cancer Maternal Grandmother 79    Social History   Tobacco Use  . Smoking status: Never Smoker  . Smokeless tobacco: Never Used  Substance Use Topics  . Alcohol use: Yes    Comment: 4-5 days a week glass of wine a night   . Drug use: Never    Review of Systems:   Pertinent items are noted in the HPI. Otherwise, ROS is negative.  Objective:   Pulse 92   Temp 98.5 F (36.9 C)  (Temporal)   Ht 5\' 3"  (1.6 m)   Wt 119 lb 6.4 oz (54.2 kg)   SpO2 99%   BMI 21.15 kg/m   General appearance: alert, cooperative and appears stated age. Head: normocephalic, without obvious abnormality, atraumatic. Neck: no adenopathy, supple, symmetrical, trachea midline; thyroid not enlarged, symmetric, no tenderness/mass/nodules. Lungs: clear to auscultation bilaterally. Heart: regular rate and rhythm Abdomen: soft, non-tender; no masses,  no organomegaly. Extremities: extremities normal, atraumatic, no cyanosis or edema. Skin: skin color, texture, turgor normal, no rashes or lesions. Lymph: cervical, supraclavicular, and axillary nodes normal; no abnormal inguinal nodes palpated. Neurologic: grossly normal.  Assessment/Plan:   Bryley was seen today for annual exam.  Diagnoses and all orders for this visit:  Routine physical examination  Screening for lipid disorders -     Lipid panel  Vitamin D deficiency -     VITAMIN D 25 Hydroxy (Vit-D Deficiency, Fractures)  Screening for HIV (human immunodeficiency virus) -     HIV Antibody (routine testing w rflx)  Attention deficit hyperactivity disorder (ADHD), combined type  Need for immunization against influenza -     Flu Vaccine QUAD 36+ mos IM  Seasonal allergies  Screening-pulmonary TB -     PPD  Fatigue, unspecified type -     CBC with Differential/Platelet -     Comprehensive metabolic panel -  Magnesium    Patient Counseling: [x]    Nutrition: Stressed importance of moderation in sodium/caffeine intake, saturated fat and cholesterol, caloric balance, sufficient intake of fresh fruits, vegetables, fiber, calcium, iron, and 1 mg of folate supplement per day (for females capable of pregnancy).  [x]    Stressed the importance of regular exercise.   [x]    Substance Abuse: Discussed cessation/primary prevention of tobacco, alcohol, or other drug use; driving or other dangerous activities under the influence;  availability of treatment for abuse.   [x]    Injury prevention: Discussed safety belts, safety helmets, smoke detector, smoking near bedding or upholstery.   [x]    Sexuality: Discussed sexually transmitted diseases, partner selection, use of condoms, avoidance of unintended pregnancy  and contraceptive alternatives.  [x]    Dental health: Discussed importance of regular tooth brushing, flossing, and dental visits.  [x]    Health maintenance and immunizations reviewed. Please refer to Health maintenance section.   , DO [facility]

## 2019-09-18 ENCOUNTER — Ambulatory Visit (INDEPENDENT_AMBULATORY_CARE_PROVIDER_SITE_OTHER): Payer: 59 | Admitting: Family Medicine

## 2019-09-18 ENCOUNTER — Encounter: Payer: Self-pay | Admitting: Family Medicine

## 2019-09-18 ENCOUNTER — Other Ambulatory Visit: Payer: Self-pay

## 2019-09-18 VITALS — HR 92 | Temp 98.5°F | Ht 63.0 in | Wt 119.4 lb

## 2019-09-18 DIAGNOSIS — R5383 Other fatigue: Secondary | ICD-10-CM

## 2019-09-18 DIAGNOSIS — Z23 Encounter for immunization: Secondary | ICD-10-CM

## 2019-09-18 DIAGNOSIS — Z0000 Encounter for general adult medical examination without abnormal findings: Secondary | ICD-10-CM | POA: Diagnosis not present

## 2019-09-18 DIAGNOSIS — F902 Attention-deficit hyperactivity disorder, combined type: Secondary | ICD-10-CM

## 2019-09-18 DIAGNOSIS — Z13220 Encounter for screening for lipoid disorders: Secondary | ICD-10-CM

## 2019-09-18 DIAGNOSIS — J302 Other seasonal allergic rhinitis: Secondary | ICD-10-CM

## 2019-09-18 DIAGNOSIS — Z114 Encounter for screening for human immunodeficiency virus [HIV]: Secondary | ICD-10-CM

## 2019-09-18 DIAGNOSIS — Z111 Encounter for screening for respiratory tuberculosis: Secondary | ICD-10-CM

## 2019-09-18 DIAGNOSIS — E559 Vitamin D deficiency, unspecified: Secondary | ICD-10-CM

## 2019-09-18 LAB — COMPREHENSIVE METABOLIC PANEL
ALT: 14 U/L (ref 0–35)
AST: 15 U/L (ref 0–37)
Albumin: 4.3 g/dL (ref 3.5–5.2)
Alkaline Phosphatase: 34 U/L — ABNORMAL LOW (ref 39–117)
BUN: 13 mg/dL (ref 6–23)
CO2: 24 mEq/L (ref 19–32)
Calcium: 9.1 mg/dL (ref 8.4–10.5)
Chloride: 106 mEq/L (ref 96–112)
Creatinine, Ser: 0.71 mg/dL (ref 0.40–1.20)
GFR: 90.74 mL/min (ref >60.00)
Glucose, Bld: 91 mg/dL (ref 70–99)
Potassium: 4.3 mEq/L (ref 3.5–5.1)
Sodium: 138 mEq/L (ref 135–145)
Total Bilirubin: 0.5 mg/dL (ref 0.2–1.2)
Total Protein: 7 g/dL (ref 6.0–8.3)

## 2019-09-18 LAB — CBC WITH DIFFERENTIAL/PLATELET
Basophils Absolute: 0 10*3/uL (ref 0.0–0.1)
Basophils Relative: 0.7 % (ref 0.0–3.0)
Eosinophils Absolute: 0.1 10*3/uL (ref 0.0–0.7)
Eosinophils Relative: 4.1 % (ref 0.0–5.0)
HCT: 41 % (ref 36.0–46.0)
Hemoglobin: 13.6 g/dL (ref 12.0–15.0)
Lymphocytes Relative: 33.1 % (ref 12.0–46.0)
Lymphs Abs: 1.2 10*3/uL (ref 0.7–4.0)
MCHC: 33.2 g/dL (ref 30.0–36.0)
MCV: 92.2 fl (ref 78.0–100.0)
Monocytes Absolute: 0.3 10*3/uL (ref 0.1–1.0)
Monocytes Relative: 7.1 % (ref 3.0–12.0)
Neutro Abs: 2 10*3/uL (ref 1.4–7.7)
Neutrophils Relative %: 55 % (ref 43.0–77.0)
Platelets: 206 10*3/uL (ref 150.0–400.0)
RBC: 4.45 Mil/uL (ref 3.87–5.11)
RDW: 13.1 % (ref 11.5–15.5)
WBC: 3.7 10*3/uL — ABNORMAL LOW (ref 4.0–10.5)

## 2019-09-18 LAB — LIPID PANEL
Cholesterol: 187 mg/dL (ref 0–200)
HDL: 69.1 mg/dL (ref >39.00)
LDL Cholesterol: 105 mg/dL — ABNORMAL HIGH (ref 0–99)
NonHDL: 117.94
Total CHOL/HDL Ratio: 3
Triglycerides: 64 mg/dL (ref 0.0–149.0)
VLDL: 12.8 mg/dL (ref 0.0–40.0)

## 2019-09-18 LAB — VITAMIN D 25 HYDROXY (VIT D DEFICIENCY, FRACTURES): VITD: 34.81 ng/mL (ref 30.00–100.00)

## 2019-09-18 LAB — MAGNESIUM: Magnesium: 2.2 mg/dL (ref 1.5–2.5)

## 2019-09-18 NOTE — Patient Instructions (Addendum)
Health Maintenance Due  Topic Date Due  . HIV Screening  10/06/1993  . PAP SMEAR-Modifier  10/07/1999  Will discuss during visit.  Depression screen [facility] 2/9 09/18/2019 03/15/2019  Decreased Interest 0 0  Down, Depressed, Hopeless 0 0  PHQ - 2 Score 0 0  Altered sleeping 0 0  Tired, decreased energy 0 0  Change in appetite 0 0  Feeling bad or failure about yourself  0 0  Trouble concentrating 0 0  Moving slowly or fidgety/restless 0 0  Suicidal thoughts 0 0  PHQ-9 Score 0 0  Difficult doing work/chores Not difficult at all Not difficult at all

## 2019-09-19 LAB — HIV ANTIBODY (ROUTINE TESTING W REFLEX): HIV 1&2 Ab, 4th Generation: NONREACTIVE

## 2019-09-20 ENCOUNTER — Ambulatory Visit: Payer: 59

## 2019-09-20 ENCOUNTER — Other Ambulatory Visit: Payer: Self-pay

## 2019-09-20 DIAGNOSIS — Z111 Encounter for screening for respiratory tuberculosis: Secondary | ICD-10-CM

## 2019-09-20 LAB — TB SKIN TEST
Induration: 0 mm
TB Skin Test: NEGATIVE

## 2019-09-24 ENCOUNTER — Encounter: Payer: Self-pay | Admitting: Family Medicine

## 2019-09-27 ENCOUNTER — Other Ambulatory Visit: Payer: Self-pay

## 2019-09-27 DIAGNOSIS — R899 Unspecified abnormal finding in specimens from other organs, systems and tissues: Secondary | ICD-10-CM

## 2019-10-03 ENCOUNTER — Other Ambulatory Visit (INDEPENDENT_AMBULATORY_CARE_PROVIDER_SITE_OTHER): Payer: 59

## 2019-10-03 ENCOUNTER — Other Ambulatory Visit: Payer: Self-pay

## 2019-10-03 DIAGNOSIS — R899 Unspecified abnormal finding in specimens from other organs, systems and tissues: Secondary | ICD-10-CM

## 2019-10-03 LAB — CBC WITH DIFFERENTIAL/PLATELET
Basophils Absolute: 0 10*3/uL (ref 0.0–0.1)
Basophils Relative: 0.5 % (ref 0.0–3.0)
Eosinophils Absolute: 0.1 10*3/uL (ref 0.0–0.7)
Eosinophils Relative: 3.3 % (ref 0.0–5.0)
HCT: 40.8 % (ref 36.0–46.0)
Hemoglobin: 13.5 g/dL (ref 12.0–15.0)
Lymphocytes Relative: 38.7 % (ref 12.0–46.0)
Lymphs Abs: 1.6 10*3/uL (ref 0.7–4.0)
MCHC: 33 g/dL (ref 30.0–36.0)
MCV: 91.7 fl (ref 78.0–100.0)
Monocytes Absolute: 0.3 10*3/uL (ref 0.1–1.0)
Monocytes Relative: 8 % (ref 3.0–12.0)
Neutro Abs: 2 10*3/uL (ref 1.4–7.7)
Neutrophils Relative %: 49.5 % (ref 43.0–77.0)
Platelets: 199 10*3/uL (ref 150.0–400.0)
RBC: 4.45 Mil/uL (ref 3.87–5.11)
RDW: 13.1 % (ref 11.5–15.5)
WBC: 4.1 10*3/uL (ref 4.0–10.5)

## 2019-10-14 ENCOUNTER — Other Ambulatory Visit: Payer: Self-pay | Admitting: Family Medicine

## 2019-10-14 DIAGNOSIS — F902 Attention-deficit hyperactivity disorder, combined type: Secondary | ICD-10-CM

## 2019-10-17 NOTE — Telephone Encounter (Signed)
My chart sent to patient to see if has new PCP

## 2019-10-18 MED ORDER — AMPHETAMINE-DEXTROAMPHET ER 20 MG PO CP24: 20.0000 mg | ORAL_CAPSULE | Freq: Every day | ORAL | 0 refills | Status: DC

## 2019-10-18 NOTE — Telephone Encounter (Signed)
Ok to fill? Per my chart message

## 2019-11-20 ENCOUNTER — Encounter (INDEPENDENT_AMBULATORY_CARE_PROVIDER_SITE_OTHER): Payer: Self-pay | Admitting: Family Medicine

## 2019-11-21 NOTE — Telephone Encounter (Signed)
Please review

## 2019-11-27 ENCOUNTER — Encounter: Payer: Self-pay | Admitting: Physician Assistant

## 2019-11-27 ENCOUNTER — Ambulatory Visit (INDEPENDENT_AMBULATORY_CARE_PROVIDER_SITE_OTHER): Payer: 59 | Admitting: Physician Assistant

## 2019-11-27 VITALS — Temp 98.6°F | Ht 63.0 in | Wt 118.0 lb

## 2019-11-27 DIAGNOSIS — N912 Amenorrhea, unspecified: Secondary | ICD-10-CM

## 2019-11-27 DIAGNOSIS — F902 Attention-deficit hyperactivity disorder, combined type: Secondary | ICD-10-CM | POA: Diagnosis not present

## 2019-11-27 MED ORDER — AMPHETAMINE-DEXTROAMPHET ER 20 MG PO CP24: 20.0000 mg | ORAL_CAPSULE | ORAL | 0 refills | Status: DC

## 2019-11-27 MED ORDER — AMPHETAMINE-DEXTROAMPHET ER 20 MG PO CP24: 20.0000 mg | ORAL_CAPSULE | Freq: Every day | ORAL | 0 refills | Status: DC

## 2019-11-27 NOTE — Progress Notes (Signed)
Virtual Visit via Video   I connected with Angelica Lee on 11/27/19 at 11:20 AM EST by a video enabled telemedicine application and verified that I am speaking with the correct person using two identifiers. Location patient: Home Location provider: [facility], Office Persons participating in the virtual visit: Byron Davanzo, Inda Coke PA-C,Donna Orphanos, LPN   I discussed the limitations of evaluation and management by telemedicine and the availability of in person appointments. The patient expressed understanding and agreed to proceed.  I acted as a Education administrator for [employer], PA-C [employer], LPN  Subjective:   HPI:  Pt is for transfer of care from Dr. Juleen China today.  ADHD Pt following up, currently taking Adderall XR 20 mg daily. Pt is tolerating medication well and needs refill.  Was diagnosed with ADHD in 3rd grade. And got retested about 10-15 years ago. Currently on Adderall 20 mg XR daily. Denies any concerns with uncontrolled attention.   I checked the PMP Aware, 3 month f/u:  Controlled substance: Adderall XR 20 mg  Pharmacy: [facility] [phone]) [place], [place]  [address] [phone] Status: Last Rx filled 10/20/2019, # 30, 0 refill New Findings: None  Amenorrhea She states that she missed her period at the beginning of the month. States that she is on birth control and didn't have her period at the end of her last pack, like she typically does. She started a new birth control pack and plans to see if she has a period next time she is supposed to. She has not taken a pregnancy test. She typically has very light periods with her OCP.  ROS: See pertinent positives and negatives per HPI.  Patient Active Problem List   Diagnosis Date Noted  . Rotator cuff syndrome of right shoulder 11/26/2017  . ADHD (attention deficit hyperactivity disorder) 11/02/2013    Social History   Tobacco Use  . Smoking status: Never Smoker  .  Smokeless tobacco: Never Used  Substance Use Topics  . Alcohol use: Yes    Comment: 4-5 days a week glass of wine a night     Current Outpatient Medications:  .  [START ON 01/26/2020] amphetamine-dextroamphetamine (ADDERALL XR) 20 MG 24 hr capsule, Take 1 capsule (20 mg total) by mouth daily., Disp: 30 capsule, Rfl: 0 .  cetirizine (ZYRTEC) 10 MG tablet, Take 10 mg by mouth daily., Disp: , Rfl:  .  cholecalciferol (VITAMIN D3) 25 MCG (1000 UT) tablet, Take 1,000 Units by mouth daily., Disp: , Rfl:  .  norethindrone-ethinyl estradiol (CYCLAFEM,ALYACEN) 0.5/0.75/1-35 MG-MCG tablet, Take 1 tablet by mouth daily., Disp: 3 Package, Rfl: 3 .  [START ON 12/27/2019] amphetamine-dextroamphetamine (ADDERALL XR) 20 MG 24 hr capsule, Take 1 capsule (20 mg total) by mouth every morning., Disp: 30 capsule, Rfl: 0 .  amphetamine-dextroamphetamine (ADDERALL XR) 20 MG 24 hr capsule, Take 1 capsule (20 mg total) by mouth every morning., Disp: 30 capsule, Rfl: 0  No Known Allergies  Objective:   VITALS: Per patient if applicable, see vitals. GENERAL: Alert, appears well and in no acute distress. HEENT: Atraumatic, conjunctiva clear, no obvious abnormalities on inspection of external nose and ears. NECK: Normal movements of the head and neck. CARDIOPULMONARY: No increased WOB. Speaking in clear sentences. I:E ratio WNL.  MS: Moves all visible extremities without noticeable abnormality. PSYCH: Pleasant and cooperative, well-groomed. Speech normal rate and rhythm. Affect is appropriate. Insight and judgement are appropriate. Attention is focused, linear, and appropriate.  NEURO:  CN grossly intact. Oriented as arrived to appointment on time with no prompting. Moves both UE equally.  SKIN: No obvious lesions, wounds, erythema, or cyanosis noted on face or hands.  Assessment and Plan:   Stellar was seen today for transfer of care, adhd and medication refill.  Diagnoses and all orders for this  visit:  Attention deficit hyperactivity disorder (ADHD), combined type Ok to refill medication at this time. Follow-up in 3 months, sooner if concerns. -     amphetamine-dextroamphetamine (ADDERALL XR) 20 MG 24 hr capsule; Take 1 capsule (20 mg total) by mouth daily.  Amenorrhea Unclear etiology. I recommended that she take a urine pregnancy test and notify us of results. Follow-up based on results of test and if period returns this month.  Other orders -     amphetamine-dextroamphetamine (ADDERALL XR) 20 MG 24 hr capsule; Take 1 capsule (20 mg total) by mouth every morning. -     amphetamine-dextroamphetamine (ADDERALL XR) 20 MG 24 hr capsule; Take 1 capsule (20 mg total) by mouth every morning.    . Reviewed expectations re: course of current medical issues. . Discussed self-management of symptoms. . Outlined signs and symptoms indicating need for more acute intervention. . Patient verbalized understanding and all questions were answered. Marland Kitchen Health Maintenance issues including appropriate healthy diet, exercise, and smoking avoidance were discussed with patient. . See orders for this visit as documented in the electronic medical record.  I discussed the assessment and treatment plan with the patient. The patient was provided an opportunity to ask questions and all were answered. The patient agreed with the plan and demonstrated an understanding of the instructions.   The patient was advised to call back or seek an in-person evaluation if the symptoms worsen or if the condition fails to improve as anticipated.   CMA or LPN served as scribe during this visit. History, Physical, and Plan performed by medical provider. The above documentation has been reviewed and is accurate and complete.   [place], [place] 11/27/2019

## 2019-11-27 NOTE — Telephone Encounter (Signed)
Pt scheduled today with Samantha. 

## 2019-11-29 ENCOUNTER — Encounter: Payer: Self-pay | Admitting: Physician Assistant

## 2019-12-27 ENCOUNTER — Other Ambulatory Visit: Payer: Self-pay | Admitting: Physician Assistant

## 2019-12-27 ENCOUNTER — Encounter: Payer: Self-pay | Admitting: Physician Assistant

## 2019-12-28 MED ORDER — AMPHETAMINE-DEXTROAMPHET ER 20 MG PO CP24: 20.0000 mg | ORAL_CAPSULE | ORAL | 0 refills | Status: DC

## 2019-12-28 NOTE — Telephone Encounter (Signed)
Pt requesting refill on Adderall XR 20 mg. Last OV 11/27/2019.

## 2020-02-01 ENCOUNTER — Other Ambulatory Visit: Payer: Self-pay | Admitting: Physician Assistant

## 2020-02-02 MED ORDER — AMPHETAMINE-DEXTROAMPHET ER 20 MG PO CP24: 20.0000 mg | ORAL_CAPSULE | ORAL | 0 refills | Status: DC

## 2020-02-02 NOTE — Telephone Encounter (Signed)
Requesting refill for Adderall XR 20 mg. Appt scheduled for 02/12/2020.

## 2020-02-12 ENCOUNTER — Other Ambulatory Visit: Payer: Self-pay | Admitting: Physician Assistant

## 2020-02-12 ENCOUNTER — Encounter: Payer: Self-pay | Admitting: Physician Assistant

## 2020-02-12 ENCOUNTER — Telehealth: Payer: Self-pay | Admitting: Physician Assistant

## 2020-02-12 ENCOUNTER — Ambulatory Visit (INDEPENDENT_AMBULATORY_CARE_PROVIDER_SITE_OTHER): Payer: 59 | Admitting: Physician Assistant

## 2020-02-12 ENCOUNTER — Other Ambulatory Visit: Payer: Self-pay

## 2020-02-12 VITALS — BP 116/70 | HR 93 | Temp 98.2°F | Ht 63.0 in | Wt 121.4 lb

## 2020-02-12 DIAGNOSIS — F902 Attention-deficit hyperactivity disorder, combined type: Secondary | ICD-10-CM | POA: Diagnosis not present

## 2020-02-12 DIAGNOSIS — N926 Irregular menstruation, unspecified: Secondary | ICD-10-CM

## 2020-02-12 LAB — CBC WITH DIFFERENTIAL/PLATELET
Basophils Absolute: 0 10*3/uL (ref 0.0–0.1)
Basophils Relative: 0.8 % (ref 0.0–3.0)
Eosinophils Absolute: 0.2 10*3/uL (ref 0.0–0.7)
Eosinophils Relative: 5.3 % — ABNORMAL HIGH (ref 0.0–5.0)
HCT: 43.3 % (ref 36.0–46.0)
Hemoglobin: 14.3 g/dL (ref 12.0–15.0)
Lymphocytes Relative: 41.1 % (ref 12.0–46.0)
Lymphs Abs: 1.6 10*3/uL (ref 0.7–4.0)
MCHC: 32.9 g/dL (ref 30.0–36.0)
MCV: 92 fl (ref 78.0–100.0)
Monocytes Absolute: 0.3 10*3/uL (ref 0.1–1.0)
Monocytes Relative: 8.5 % (ref 3.0–12.0)
Neutro Abs: 1.7 10*3/uL (ref 1.4–7.7)
Neutrophils Relative %: 44.3 % (ref 43.0–77.0)
Platelets: 220 10*3/uL (ref 150.0–400.0)
RBC: 4.7 Mil/uL (ref 3.87–5.11)
RDW: 13.3 % (ref 11.5–15.5)
WBC: 3.8 10*3/uL — ABNORMAL LOW (ref 4.0–10.5)

## 2020-02-12 LAB — TSH: TSH: 1.49 u[IU]/mL (ref 0.35–4.50)

## 2020-02-12 LAB — COMPREHENSIVE METABOLIC PANEL
ALT: 14 U/L (ref 0–35)
AST: 15 U/L (ref 0–37)
Albumin: 4.2 g/dL (ref 3.5–5.2)
Alkaline Phosphatase: 41 U/L (ref 39–117)
BUN: 13 mg/dL (ref 6–23)
CO2: 26 mEq/L (ref 19–32)
Calcium: 9.3 mg/dL (ref 8.4–10.5)
Chloride: 105 mEq/L (ref 96–112)
Creatinine, Ser: 0.68 mg/dL (ref 0.40–1.20)
GFR: 95.19 mL/min (ref >60.00)
Glucose, Bld: 82 mg/dL (ref 70–99)
Potassium: 4.4 mEq/L (ref 3.5–5.1)
Sodium: 139 mEq/L (ref 135–145)
Total Bilirubin: 0.4 mg/dL (ref 0.2–1.2)
Total Protein: 7.1 g/dL (ref 6.0–8.3)

## 2020-02-12 LAB — T4, FREE: Free T4: 0.79 ng/dL (ref 0.60–1.60)

## 2020-02-12 MED ORDER — AMPHETAMINE-DEXTROAMPHET ER 20 MG PO CP24: 20.0000 mg | ORAL_CAPSULE | ORAL | 0 refills | Status: DC

## 2020-02-12 NOTE — Patient Instructions (Signed)
It was great to see you!  Please let me know if you have any concerns with the Adderall.  I will be in touch with your lab results.  Let's follow-up in 6 months, sooner if you have concerns.  Take care,  Jarold Motto PA-C

## 2020-02-12 NOTE — Telephone Encounter (Signed)
[facility] called stating they received 2 prescriptions for 90 day supply of Adderall. Pt is only able to accept 3 months at a time so the pharmacist will not be able to write the prescription for month of May.

## 2020-02-12 NOTE — Telephone Encounter (Signed)
Spoke to pharmacist Tresa Endo, told her calling about Rx for Adderall. So you will not be able to hold Rx till due again will have to send new Rx when due. Tresa Endo said yes that is correct. Told her okay.

## 2020-02-12 NOTE — Progress Notes (Signed)
Angelica Lee is a 44 y.o. female is here for follow up on ADHD.  I acted as a Neurosurgeon for [employer], PA-C Corky Mull, LPN  History of Present Illness:   Chief Complaint  Patient presents with  . ADHD    HPI    Pt for 3 month follow up. I checked the PMP Aware  Controlled substance: Adderall XR 20 mg one capsule daily. [facility] [address] [place], [place], [place] [address] phone [phone] Status: Last Rx filled 02/02/2020 Adderall XR 20 mg , dispensed # 30, refill 0 New Findings: None  ADHD Pt following up, currently taking Adderall XR 20 mg daily. Tolerating medication. Denies: palpitations, insomnia, worsening mood. Feels this medication is working well for her.  Irregular periods Intermittently having issues with skipped periods. Has been going on for several months. When she does have periods they are not unusual in nature (not very heavy/painful.) No hx PCOS. Does not have an ob-gyn.    There are no preventive care reminders to display for this patient.  History reviewed. No pertinent past medical history.   Social History   Socioeconomic History  . Marital status: Married    Spouse name: Not on file  . Number of children: Not on file  . Years of education: Not on file  . Highest education level: Not on file  Occupational History  . Not on file  Tobacco Use  . Smoking status: Never Smoker  . Smokeless tobacco: Never Used  Substance and Sexual Activity  . Alcohol use: Yes    Comment: 4-5 days a week glass of wine a night   . Drug use: Never  . Sexual activity: Not on file  Other Topics Concern  . Not on file  Social History Narrative   Certified Diabetes Educator -- working FT   Married   2 kids -- 63 yo and 24 yo   Social Determinants of Corporate investment banker Strain:   . Difficulty of Paying Living Expenses: Not on file  Food Insecurity:   . Worried About Programme researcher, broadcasting/film/video in the Last Year: Not on file  . Ran Out of  Food in the Last Year: Not on file  Transportation Needs:   . Lack of Transportation (Medical): Not on file  . Lack of Transportation (Non-Medical): Not on file  Physical Activity:   . Days of Exercise per Week: Not on file  . Minutes of Exercise per Session: Not on file  Stress:   . Feeling of Stress : Not on file  Social Connections:   . Frequency of Communication with Friends and Family: Not on file  . Frequency of Social Gatherings with Friends and Family: Not on file  . Attends Religious Services: Not on file  . Active Member of Clubs or Organizations: Not on file  . Attends Banker Meetings: Not on file  . Marital Status: Not on file  Intimate Partner Violence:   . Fear of Current or Ex-Partner: Not on file  . Emotionally Abused: Not on file  . Physically Abused: Not on file  . Sexually Abused: Not on file    Past Surgical History:  Procedure Laterality Date  . DILATION AND CURETTAGE OF UTERUS  2008    Family History  Problem Relation Age of Onset  . Hypertension Mother   . Hyperlipidemia Mother   . Hypertension Father   . ADD / ADHD Father   . Diabetes Sister  she is twin   . Breast cancer Maternal Grandmother 36    PMHx, SurgHx, SocialHx, FamHx, Medications, and Allergies were reviewed in the Visit Navigator and updated as appropriate.   Patient Active Problem List   Diagnosis Date Noted  . Rotator cuff syndrome of right shoulder 11/26/2017  . ADHD (attention deficit hyperactivity disorder) 11/02/2013    Social History   Tobacco Use  . Smoking status: Never Smoker  . Smokeless tobacco: Never Used  Substance Use Topics  . Alcohol use: Yes    Comment: 4-5 days a week glass of wine a night   . Drug use: Never    Current Medications and Allergies:    Current Outpatient Medications:  .  amphetamine-dextroamphetamine (ADDERALL XR) 20 MG 24 hr capsule, Take 1 capsule (20 mg total) by mouth every morning., Disp: 90 capsule, Rfl: 0 .   cholecalciferol (VITAMIN D3) 25 MCG (1000 UT) tablet, Take 1,000 Units by mouth daily., Disp: , Rfl:  .  norethindrone-ethinyl estradiol (CYCLAFEM,ALYACEN) 0.5/0.75/1-35 MG-MCG tablet, Take 1 tablet by mouth daily., Disp: 3 Package, Rfl: 3 .  [START ON 05/12/2020] amphetamine-dextroamphetamine (ADDERALL XR) 20 MG 24 hr capsule, Take 1 capsule (20 mg total) by mouth every morning., Disp: 90 capsule, Rfl: 0  No Known Allergies  Review of Systems   ROS  Negative unless otherwise specified per HPI.  Vitals:   Vitals:   02/12/20 0900  BP: 116/70  Pulse: 93  Temp: 98.2 F (36.8 C)  TempSrc: Temporal  SpO2: 98%  Weight: 121 lb 6.1 oz (55.1 kg)  Height: 5\' 3"  (1.6 m)     Body mass index is 21.5 kg/m.   Physical Exam:    Physical Exam Vitals and nursing note reviewed.  Constitutional:      General: She is not in acute distress.    Appearance: She is well-developed. She is not ill-appearing or toxic-appearing.  Cardiovascular:     Rate and Rhythm: Normal rate and regular rhythm.     Pulses: Normal pulses.     Heart sounds: Normal heart sounds, S1 normal and S2 normal.     Comments: No LE edema Pulmonary:     Effort: Pulmonary effort is normal.     Breath sounds: Normal breath sounds.  Skin:    General: Skin is warm and dry.  Neurological:     Mental Status: She is alert.     GCS: GCS eye subscore is 4. GCS verbal subscore is 5. GCS motor subscore is 6.  Psychiatric:        Speech: Speech normal.        Behavior: Behavior normal. Behavior is cooperative.        Assessment and Plan:   Haylin was seen today for adhd.  Diagnoses and all orders for this visit:  Irregular periods Will update CBC, CMP, TSH and Free T4. If labs normal, will likely refer to [facility] (gyn) for further evaluation and management. -     CBC with Differential/Platelet -     Comprehensive metabolic panel -     TSH -     T4, free  Attention deficit hyperactivity disorder (ADHD), combined  type Well controlled. Contract signed today. Will refill Adderall XR 20 mg daily x 6 months. Follow-up in 6 months, sooner if concerns.  Other orders -     amphetamine-dextroamphetamine (ADDERALL XR) 20 MG 24 hr capsule; Take 1 capsule (20 mg total) by mouth every morning. -     amphetamine-dextroamphetamine (ADDERALL XR)  20 MG 24 hr capsule; Take 1 capsule (20 mg total) by mouth every morning.  . Reviewed expectations re: course of current medical issues. . Discussed self-management of symptoms. . Outlined signs and symptoms indicating need for more acute intervention. . Patient verbalized understanding and all questions were answered. . See orders for this visit as documented in the electronic medical record. . Patient received an After Visit Summary.  CMA or LPN served as scribe during this visit. History, Physical, and Plan performed by medical provider. The above documentation has been reviewed and is accurate and complete.  Jarold Motto, PA-C [place] 02/12/2020  Follow-up: No follow-ups on file.

## 2020-02-12 NOTE — Progress Notes (Signed)
g

## 2020-02-12 NOTE — Progress Notes (Deleted)
Virtual Visit via Video   I connected with Angelica Lee on 02/12/20 at  9:00 AM EST by a video enabled telemedicine application and verified that I am speaking with the correct person using two identifiers. Location patient: Home Location provider: [facility], Office Persons participating in the virtual visit: Beacon Eilers, Inda Coke PA-C, Anselmo Pickler, LPN   I discussed the limitations of evaluation and management by telemedicine and the availability of in person appointments. The patient expressed understanding and agreed to proceed.  I acted as a Education administrator for [employer], [employer], LPN  Subjective:   HPI:  Pt for 3 month follow up. I checked the PMP Aware Controlled substance: Adderall XR 20 mg one capsule daily.  [facility] [place], [place], [place] [address] phone [phone]  Status: Last Rx filled 02/02/2020 Adderall XR 20 mg , dispensed # 30, refill 0  New Findings: None  ADHD Pt following up, currently taking Adderall XR 20 mg capsule daily.  ROS: See pertinent positives and negatives per HPI.  Patient Active Problem List   Diagnosis Date Noted  . Rotator cuff syndrome of right shoulder 11/26/2017  . ADHD (attention deficit hyperactivity disorder) 11/02/2013    Social History   Tobacco Use  . Smoking status: Never Smoker  . Smokeless tobacco: Never Used  Substance Use Topics  . Alcohol use: Yes    Comment: 4-5 days a week glass of wine a night     Current Outpatient Medications:  .  amphetamine-dextroamphetamine (ADDERALL XR) 20 MG 24 hr capsule, Take 1 capsule (20 mg total) by mouth every morning., Disp: 30 capsule, Rfl: 0 .  cetirizine (ZYRTEC) 10 MG tablet, Take 10 mg by mouth daily., Disp: , Rfl:  .  cholecalciferol (VITAMIN D3) 25 MCG (1000 UT) tablet, Take 1,000 Units by mouth daily., Disp: , Rfl:  .  norethindrone-ethinyl estradiol (CYCLAFEM,ALYACEN) 0.5/0.75/1-35 MG-MCG tablet, Take 1 tablet by mouth  daily., Disp: 3 Package, Rfl: 3  No Known Allergies  Objective:   VITALS: Per patient if applicable, see vitals. GENERAL: Alert, appears well and in no acute distress. HEENT: Atraumatic, conjunctiva clear, no obvious abnormalities on inspection of external nose and ears. NECK: Normal movements of the head and neck. CARDIOPULMONARY: No increased WOB. Speaking in clear sentences. I:E ratio WNL.  MS: Moves all visible extremities without noticeable abnormality. PSYCH: Pleasant and cooperative, well-groomed. Speech normal rate and rhythm. Affect is appropriate. Insight and judgement are appropriate. Attention is focused, linear, and appropriate.  NEURO: CN grossly intact. Oriented as arrived to appointment on time with no prompting. Moves both UE equally.  SKIN: No obvious lesions, wounds, erythema, or cyanosis noted on face or hands.  Assessment and Plan:   There are no diagnoses linked to this encounter.  . Reviewed expectations re: course of current medical issues. . Discussed self-management of symptoms. . Outlined signs and symptoms indicating need for more acute intervention. . Patient verbalized understanding and all questions were answered. Marland Kitchen Health Maintenance issues including appropriate healthy diet, exercise, and smoking avoidance were discussed with patient. . See orders for this visit as documented in the electronic medical record.  I discussed the assessment and treatment plan with the patient. The patient was provided an opportunity to ask questions and all were answered. The patient agreed with the plan and demonstrated an understanding of the instructions.   The patient was advised to call back or seek an in-person evaluation if the symptoms worsen or if  the condition fails to improve as anticipated.   ***  Corky Mull, LPN 0/12/5613

## 2020-02-14 ENCOUNTER — Encounter: Payer: Self-pay | Admitting: Obstetrics and Gynecology

## 2020-03-05 ENCOUNTER — Other Ambulatory Visit: Payer: Self-pay

## 2020-03-05 ENCOUNTER — Ambulatory Visit (INDEPENDENT_AMBULATORY_CARE_PROVIDER_SITE_OTHER): Payer: 59 | Admitting: Obstetrics and Gynecology

## 2020-03-05 ENCOUNTER — Telehealth: Payer: Self-pay

## 2020-03-05 ENCOUNTER — Encounter: Payer: Self-pay | Admitting: Obstetrics and Gynecology

## 2020-03-05 VITALS — BP 118/60 | HR 79 | Temp 98.6°F | Ht 63.0 in | Wt 117.0 lb

## 2020-03-05 DIAGNOSIS — Z01419 Encounter for gynecological examination (general) (routine) without abnormal findings: Secondary | ICD-10-CM | POA: Diagnosis not present

## 2020-03-05 DIAGNOSIS — Z3041 Encounter for surveillance of contraceptive pills: Secondary | ICD-10-CM | POA: Diagnosis not present

## 2020-03-05 MED ORDER — NORETHIN-ETH ESTRAD TRIPHASIC 0.5/0.75/1-35 MG-MCG PO TABS: 1.0000 | ORAL_TABLET | Freq: Every day | ORAL | 3 refills | Status: DC

## 2020-03-05 NOTE — Telephone Encounter (Signed)
I would have her continue on the pill that she is currently taking, she is bleeding less on the current pill. Please refill for one year.

## 2020-03-05 NOTE — Patient Instructions (Signed)
Mammogram Facilities  Yearly screening mammograms are recommended for women beginning at age 42. For a routine screening mammogram, you may schedule the appointment and have it done at the location of your choice.  Please ask the facility to send the results to our office. (fax [phone]) Location options include:  [facility] [address], [address] [place], [place] [address] [phone]  EXERCISE AND DIET:  We recommended that you start or continue a regular exercise program for good health. Regular exercise means any activity that makes your heart beat faster and makes you sweat.  We recommend exercising at least 30 minutes per day at least 3 days a week, preferably 4 or 5.  We also recommend a diet low in fat and sugar.  Inactivity, poor dietary choices and obesity can cause diabetes, heart attack, stroke, and kidney damage, among others.    ALCOHOL AND SMOKING:  Women should limit their alcohol intake to no more than 7 drinks/beers/glasses of wine (combined, not each!) per week. Moderation of alcohol intake to this level decreases your risk of breast cancer and liver damage. And of course, no recreational drugs are part of a healthy lifestyle.  And absolutely no smoking or even second hand smoke. Most people know smoking can cause heart and lung diseases, but did you know it also contributes to weakening of your bones? Aging of your skin?  Yellowing of your teeth and nails?  CALCIUM AND VITAMIN D:  Adequate intake of calcium and Vitamin D are recommended.  The recommendations for exact amounts of these supplements seem to change often, but generally speaking 1,000 mg of calcium (between diet and supplement) and 800 units of Vitamin D per day seems prudent. Certain women may benefit from higher intake of Vitamin D.  If you are among these women, your doctor will have told you during your visit.    PAP SMEARS:  Pap smears, to check for cervical cancer or  precancers,  have traditionally been done yearly, although recent scientific advances have shown that most women can have pap smears less often.  However, every woman still should have a physical exam from her gynecologist every year. It will include a breast check, inspection of the vulva and vagina to check for abnormal growths or skin changes, a visual exam of the cervix, and then an exam to evaluate the size and shape of the uterus and ovaries.  And after 42 years of age, a rectal exam is indicated to check for rectal cancers. We will also provide age appropriate advice regarding health maintenance, like when you should have certain vaccines, screening for sexually transmitted diseases, bone density testing, colonoscopy, mammograms, etc.   MAMMOGRAMS:  All women over 42 years old should have a yearly mammogram. Many facilities now offer a "3D" mammogram, which may cost around $50 extra out of pocket. If possible,  we recommend you accept the option to have the 3D mammogram performed.  It both reduces the number of women who will be called back for extra views which then turn out to be normal, and it is better than the routine mammogram at detecting truly abnormal areas.    COLON CANCER SCREENING: Now recommend starting at age 45. At this time colonoscopy is not covered for routine screening until 50. There are take home tests that can be done between 45-49.   COLONOSCOPY:  Colonoscopy to screen for colon cancer is recommended for all women at age 50.  We know, you   hate the idea of the prep.  We agree, BUT, having colon cancer and not knowing it is worse!!  Colon cancer so often starts as a polyp that can be seen and removed at colonscopy, which can quite literally save your life!  And if your first colonoscopy is normal and you have no family history of colon cancer, most women don't have to have it again for 10 years.  Once every ten years, you can do something that may end up saving your life, right?  We  will be happy to help you get it scheduled when you are ready.  Be sure to check your insurance coverage so you understand how much it will cost.  It may be covered as a preventative service at no cost, but you should check your particular policy.      Breast Self-Awareness Breast self-awareness means being familiar with how your breasts look and feel. It involves checking your breasts regularly and reporting any changes to your health care provider. Practicing breast self-awareness is important. A change in your breasts can be a sign of a serious medical problem. Being familiar with how your breasts look and feel allows you to find any problems early, when treatment is more likely to be successful. All women should practice breast self-awareness, including women who have had breast implants. How to do a breast self-exam One way to learn what is normal for your breasts and whether your breasts are changing is to do a breast self-exam. To do a breast self-exam: Look for Changes  1. Remove all the clothing above your waist. 2. Stand in front of a mirror in a room with good lighting. 3. Put your hands on your hips. 4. Push your hands firmly downward. 5. Compare your breasts in the mirror. Look for differences between them (asymmetry), such as: ? Differences in shape. ? Differences in size. ? Puckers, dips, and bumps in one breast and not the other. 6. Look at each breast for changes in your skin, such as: ? Redness. ? Scaly areas. 7. Look for changes in your nipples, such as: ? Discharge. ? Bleeding. ? Dimpling. ? Redness. ? A change in position. Feel for Changes Carefully feel your breasts for lumps and changes. It is best to do this while lying on your back on the floor and again while sitting or standing in the shower or tub with soapy water on your skin. Feel each breast in the following way:  Place the arm on the side of the breast you are examining above your head.  Feel your  breast with the other hand.  Start in the nipple area and make  inch (2 cm) overlapping circles to feel your breast. Use the pads of your three middle fingers to do this. Apply light pressure, then medium pressure, then firm pressure. The light pressure will allow you to feel the tissue closest to the skin. The medium pressure will allow you to feel the tissue that is a little deeper. The firm pressure will allow you to feel the tissue close to the ribs.  Continue the overlapping circles, moving downward over the breast until you feel your ribs below your breast.  Move one finger-width toward the center of the body. Continue to use the  inch (2 cm) overlapping circles to feel your breast as you move slowly up toward your collarbone.  Continue the up and down exam using all three pressures until you reach your armpit.  Write Down What You   Find  Write down what is normal for each breast and any changes that you find. Keep a written record with breast changes or normal findings for each breast. By writing this information down, you do not need to depend only on memory for size, tenderness, or location. Write down where you are in your menstrual cycle, if you are still menstruating. If you are having trouble noticing differences in your breasts, do not get discouraged. With time you will become more familiar with the variations in your breasts and more comfortable with the exam. How often should I examine my breasts? Examine your breasts every month. If you are breastfeeding, the best time to examine your breasts is after a feeding or after using a breast pump. If you menstruate, the best time to examine your breasts is 5-7 days after your period is over. During your period, your breasts are lumpier, and it may be more difficult to notice changes. When should I see my health care provider? See your health care provider if you notice:  A change in shape or size of your breasts or nipples.  A change  in the skin of your breast or nipples, such as a reddened or scaly area.  Unusual discharge from your nipples.  A lump or thick area that was not there before.  Pain in your breasts.  Anything that concerns you.  

## 2020-03-05 NOTE — Telephone Encounter (Signed)
Spoke to Angelica Lee at [facility].   Informed that pt had been prescribed Cyclafem as written by Dr Earlene Plater and now [place], but pharmacy filled as only cyclafem 1-35mg  and wanting to know if should prescribe 1-35mg  or do 0.5-0.75/1.35mg  ??  Routing to Dr Oscar La for review and recommedations.

## 2020-03-05 NOTE — Progress Notes (Signed)
44 y.o. [id number] Married White or Caucasian Not Hispanic or Latino female here for annual exam.  In the last 5-6 months, she is sometimes skipping a cycle. Always light. On this pill for years. She has had negative pregnancy tests. No dyspareunia.  Period Duration (Days): 4 Period Pattern: (!) Irregular Menstrual Flow: Light Menstrual Control: Tampon(lite or regular) Menstrual Control Change Freq (Hours): 4-6 Dysmenorrhea: (!) Mild Dysmenorrhea Symptoms: Headache  Patient's last menstrual period was 01/01/2020.          Sexually active: Yes.    The current method of family planning is OCP (estrogen/progesterone).    Exercising: Yes.    Walking , running with puppy Smoker:  no  Health Maintenance: Pap:  06/15/18 normal, negative hpv (at [facility])  History of abnormal Pap:  Yes, follow up was normal MMG:  None  BMD:   None  Colonoscopy: 10 years ago normal  TDaP:  06/24/16 Gardasil: no    reports that she has never smoked. She has never used smokeless tobacco. She reports current alcohol use. She reports that she does not use drugs. 5 drinks a weeks. Certified diabetes educator, works for a [employer]. Kids are 23 (girl) and 27 (boy). Has a 3 month golden retriever puppy.   Past Medical History:  Diagnosis Date  . Amenorrhea     Past Surgical History:  Procedure Laterality Date  . DILATION AND CURETTAGE OF UTERUS  2008    Current Outpatient Medications  Medication Sig Dispense Refill  . amphetamine-dextroamphetamine (ADDERALL XR) 20 MG 24 hr capsule Take 1 capsule (20 mg total) by mouth every morning. 90 capsule 0  . cholecalciferol (VITAMIN D3) 25 MCG (1000 UT) tablet Take 1,000 Units by mouth daily.    . norethindrone-ethinyl estradiol (CYCLAFEM,ALYACEN) 0.5/0.75/1-35 MG-MCG tablet Take 1 tablet by mouth daily. 3 Package 3   No current facility-administered medications for this visit.    Family History  Problem Relation Age of Onset  . Hypertension Mother   .  Hyperlipidemia Mother   . Hypertension Father   . ADD / ADHD Father   . Diabetes Sister        she is twin   . Breast cancer Maternal Grandmother 70    Review of Systems  Constitutional: Negative.   HENT: Negative.   Respiratory: Negative.   Gastrointestinal: Negative.   Genitourinary: Negative.   Musculoskeletal: Negative.   Skin: Negative.   Allergic/Immunologic: Negative.   Neurological: Positive for dizziness.  Hematological: Negative.   Psychiatric/Behavioral: Negative.     Exam:   BP 118/60   Pulse 79   Temp 98.6 F (37 C)   Ht 5\' 3"  (1.6 m)   Wt 117 lb (53.1 kg)   LMP 01/01/2020   SpO2 99%   BMI 20.73 kg/m   Weight change: @WEIGHTCHANGE @ Height:   Height: 5\' 3"  (160 cm)  Ht Readings from Last 3 Encounters:  03/05/20 5\' 3"  (1.6 m)  02/12/20 5\' 3"  (1.6 m)  11/27/19 5\' 3"  (1.6 m)    General appearance: alert, cooperative and appears stated age Head: Normocephalic, without obvious abnormality, atraumatic Neck: no adenopathy, supple, symmetrical, trachea midline and thyroid normal to inspection and palpation Lungs: clear to auscultation bilaterally Cardiovascular: regular rate and rhythm Breasts: normal appearance, no masses or tenderness Abdomen: soft, non-tender; non distended,  no masses,  no organomegaly Extremities: extremities normal, atraumatic, no cyanosis or edema Skin: Skin color, texture, turgor normal. No rashes or lesions Lymph nodes: Cervical, supraclavicular, and axillary nodes normal.  No abnormal inguinal nodes palpated Neurologic: Grossly normal   Pelvic: External genitalia:  no lesions              Urethra:  normal appearing urethra with no masses, tenderness or lesions              Bartholins and Skenes: normal                 Vagina: normal appearing vagina with normal color and discharge, no lesions              Cervix: no lesions               Bimanual Exam:  Uterus:  normal size, contour, position, consistency, mobility, non-tender  and anteverted              Adnexa: no mass, fullness, tenderness               Rectovaginal: Confirms               Anus:  normal sphincter tone, no lesions  Carolynn Serve chaperoned for the exam.  A:  Well Woman with normal exam  On OCP's  P:   No pap this year  Continue OCP's  Labs with primary  Discussed breast self exam  Discussed calcium and vit D intake

## 2020-03-06 MED ORDER — NORETHIN ACE-ETH ESTRAD-FE 1-20 MG-MCG PO TABS: 1.0000 | ORAL_TABLET | Freq: Every day | ORAL | 3 refills | Status: DC

## 2020-03-06 NOTE — Telephone Encounter (Signed)
Spoke with pt. Pt confirming that she wants to try new Rx of 0.5-0.75/1.35mg  because having a light or skipping a cycle altogether. Pt states will give update in 3 months via mychart message. Pt agreeable and verbalized understanding.   Routing to Dr Oscar La for review and any additional recommendations.

## 2020-03-06 NOTE — Telephone Encounter (Signed)
Spoke to Rosita at pharmacy and updated on Rx. Tresa Endo spoke with Angelica Lee on new Rx and Angelica Lee wanting to try new Rx that was written by Dr Oscar La 0.5-0.75/1.35mg .  Angelica Lee states that is not having a bleed while even taking placebos and is concerned since had been taking 1-35 mg with no bleeding. Willing to take new Rx and give update to Dr Oscar La.    Call placed to Angelica Lee to confirm Rx and concerns. Left message for Angelica Lee to call back to discuss. May speak with Judeth Cornfield, RN in triage.

## 2020-03-06 NOTE — Telephone Encounter (Signed)
Spoke with pt. Pt given recommendations and explanation on bleeding while on OCPs  per Dr Oscar La and pt agreeable to try new Rx of Loestrin. Pt verbalized understanding. Rx Loestrin written # 3 packs, 3 RF and sent to pharmacy on file. Rx for cyclafem cancelled.   Routing to Dr Oscar La for final review and will close encounter.

## 2020-03-06 NOTE — Telephone Encounter (Signed)
I only refilled the script because I thought that is what the patient was on. Typically I would put someone on a lower dose pill, I just continued it because I thought she was happy with the pill. I wouldn't recommend changing her to the triphasic if the pharmacy has been giving her the monophasic pill. If she is going to change pills, I would change her to loestrin 1/20. I don't know if she will bleed on this pill, but if she doesn't bleed it's because her lining is so thin it doesn't to shed. The progesterone in the pill thins the lining.

## 2020-06-02 ENCOUNTER — Other Ambulatory Visit: Payer: Self-pay | Admitting: Physician Assistant

## 2020-06-03 ENCOUNTER — Other Ambulatory Visit: Payer: Self-pay | Admitting: Physician Assistant

## 2020-06-03 MED ORDER — AMPHETAMINE-DEXTROAMPHET ER 20 MG PO CP24: 20.0000 mg | ORAL_CAPSULE | ORAL | 0 refills | Status: DC

## 2020-06-03 NOTE — Telephone Encounter (Signed)
LAST APPOINTMENT DATE: 02/12/2020   NEXT APPOINTMENT DATE: 08/14/2020   Rx Adderall LAST REFILL:  02/12/2020  QTY: 90 0Rf

## 2020-07-31 ENCOUNTER — Other Ambulatory Visit (HOSPITAL_BASED_OUTPATIENT_CLINIC_OR_DEPARTMENT_OTHER): Payer: Self-pay | Admitting: Obstetrics and Gynecology

## 2020-07-31 DIAGNOSIS — Z1231 Encounter for screening mammogram for malignant neoplasm of breast: Secondary | ICD-10-CM

## 2020-08-06 ENCOUNTER — Encounter (HOSPITAL_BASED_OUTPATIENT_CLINIC_OR_DEPARTMENT_OTHER): Payer: Self-pay

## 2020-08-06 ENCOUNTER — Other Ambulatory Visit: Payer: Self-pay

## 2020-08-06 ENCOUNTER — Ambulatory Visit (HOSPITAL_BASED_OUTPATIENT_CLINIC_OR_DEPARTMENT_OTHER)
Admission: RE | Admit: 2020-08-06 | Discharge: 2020-08-06 | Disposition: A | Discharge status: Home / Self Care | Payer: 59 | Source: Ambulatory Visit | Attending: Obstetrics and Gynecology | Admitting: Obstetrics and Gynecology

## 2020-08-06 DIAGNOSIS — Z1231 Encounter for screening mammogram for malignant neoplasm of breast: Secondary | ICD-10-CM | POA: Insufficient documentation

## 2020-08-14 ENCOUNTER — Ambulatory Visit (INDEPENDENT_AMBULATORY_CARE_PROVIDER_SITE_OTHER): Payer: 59 | Admitting: Physician Assistant

## 2020-08-14 ENCOUNTER — Encounter: Payer: Self-pay | Admitting: Physician Assistant

## 2020-08-14 ENCOUNTER — Other Ambulatory Visit: Payer: Self-pay

## 2020-08-14 VITALS — BP 120/68 | HR 89 | Temp 98.0°F | Ht 63.0 in | Wt 118.4 lb

## 2020-08-14 DIAGNOSIS — F902 Attention-deficit hyperactivity disorder, combined type: Secondary | ICD-10-CM | POA: Diagnosis not present

## 2020-08-14 MED ORDER — AMPHETAMINE-DEXTROAMPHET ER 20 MG PO CP24: 20.0000 mg | ORAL_CAPSULE | ORAL | 0 refills | Status: DC

## 2020-08-14 NOTE — Progress Notes (Signed)
Angelica Lee is a 44 y.o. female is here for follow up.  I acted as a Neurosurgeon for [employer], PA-C Corky Mull, LPN   History of Present Illness:   Chief Complaint  Patient presents with  . ADHD    HPI   ADHD Pt following up today, currently taking Adderall XR 20 mg daily. Pt tolerating medication well. Denies palpitations, insomnia, worsening mood.   Health Maintenance Due  Topic Date Due  . Hepatitis C Screening  Never done    Past Medical History:  Diagnosis Date  . Amenorrhea      Social History   Tobacco Use  . Smoking status: Never Smoker  . Smokeless tobacco: Never Used  Vaping Use  . Vaping Use: Never used  Substance Use Topics  . Alcohol use: Yes    Comment: 4-5 days a week glass of wine a night   . Drug use: Never    Past Surgical History:  Procedure Laterality Date  . DILATION AND CURETTAGE OF UTERUS  2008    Family History  Problem Relation Age of Onset  . Hypertension Mother   . Hyperlipidemia Mother   . Hypertension Father   . ADD / ADHD Father   . Diabetes Sister        she is twin   . Breast cancer Maternal Grandmother 53    PMHx, SurgHx, SocialHx, FamHx, Medications, and Allergies were reviewed in the Visit Navigator and updated as appropriate.   Patient Active Problem List   Diagnosis Date Noted  . Rotator cuff syndrome of right shoulder 11/26/2017  . ADHD (attention deficit hyperactivity disorder) 11/02/2013    Social History   Tobacco Use  . Smoking status: Never Smoker  . Smokeless tobacco: Never Used  Vaping Use  . Vaping Use: Never used  Substance Use Topics  . Alcohol use: Yes    Comment: 4-5 days a week glass of wine a night   . Drug use: Never    Current Medications and Allergies:    Current Outpatient Medications:  .  amphetamine-dextroamphetamine (ADDERALL XR) 20 MG 24 hr capsule, Take 1 capsule (20 mg total) by mouth every morning., Disp: 90 capsule, Rfl: 0 .  cholecalciferol (VITAMIN D3) 25 MCG  (1000 UT) tablet, Take 1,000 Units by mouth daily., Disp: , Rfl:  .  norethindrone-ethinyl estradiol (LOESTRIN FE) 1-20 MG-MCG tablet, Take 1 tablet by mouth daily., Disp: 3 Package, Rfl: 3  No Known Allergies  Review of Systems   ROS  Negative unless otherwise specified per HPI.  Vitals:   Vitals:   08/14/20 0846  BP: 120/68  Pulse: 89  Temp: 98 F (36.7 C)  TempSrc: Temporal  SpO2: 99%  Weight: 118 lb 6.1 oz (53.7 kg)  Height: 5\' 3"  (1.6 m)     Body mass index is 20.97 kg/m.   Physical Exam:    Physical Exam Vitals and nursing note reviewed.  Constitutional:      General: She is not in acute distress.    Appearance: She is well-developed. She is not ill-appearing or toxic-appearing.  Cardiovascular:     Rate and Rhythm: Normal rate and regular rhythm.     Pulses: Normal pulses.     Heart sounds: Normal heart sounds, S1 normal and S2 normal.     Comments: No LE edema Pulmonary:     Effort: Pulmonary effort is normal.     Breath sounds: Normal breath sounds.  Skin:    General: Skin is warm  and dry.  Neurological:     Mental Status: She is alert.     GCS: GCS eye subscore is 4. GCS verbal subscore is 5. GCS motor subscore is 6.  Psychiatric:        Speech: Speech normal.        Behavior: Behavior normal. Behavior is cooperative.      Assessment and Plan:    Latravia was seen today for adhd.  Diagnoses and all orders for this visit:  Attention deficit hyperactivity disorder (ADHD), combined type  Other orders -     amphetamine-dextroamphetamine (ADDERALL XR) 20 MG 24 hr capsule; Take 1 capsule (20 mg total) by mouth every morning.   She is doing well with this regimen. Currently well controlled. PDMP reviewed and overall compliant. Refill current regimen x 6 months.  Reviewed expectations re: course of current medical issues. . Discussed self-management of symptoms. . Outlined signs and symptoms indicating need for more acute  intervention. . Patient verbalized understanding and all questions were answered. . See orders for this visit as documented in the electronic medical record. . Patient received an After Visit Summary.  CMA or LPN served as scribe during this visit. History, Physical, and Plan performed by medical provider. The above documentation has been reviewed and is accurate and complete.   Jarold Motto, PA-C [place] 08/14/2020  Follow-up: No follow-ups on file.

## 2020-08-14 NOTE — Patient Instructions (Signed)
It was great to see you!  Glad you are doing well  Refills sent  Let's follow-up in 6 months, sooner if you have concerns.  Take care,  Jarold Motto PA-C

## 2020-09-06 ENCOUNTER — Other Ambulatory Visit: Payer: Self-pay | Admitting: Physician Assistant

## 2020-09-06 MED ORDER — AMPHETAMINE-DEXTROAMPHET ER 20 MG PO CP24: 20.0000 mg | ORAL_CAPSULE | ORAL | 0 refills | Status: DC

## 2020-09-06 NOTE — Telephone Encounter (Signed)
Refill request , but not due yet. Last refill was 08/14/2020. I can't refuse it you have to.

## 2020-09-24 ENCOUNTER — Other Ambulatory Visit: Payer: Self-pay

## 2020-09-24 ENCOUNTER — Ambulatory Visit (INDEPENDENT_AMBULATORY_CARE_PROVIDER_SITE_OTHER): Payer: 59

## 2020-09-24 ENCOUNTER — Encounter: Payer: Self-pay | Admitting: Physician Assistant

## 2020-09-24 DIAGNOSIS — Z23 Encounter for immunization: Secondary | ICD-10-CM

## 2020-10-04 ENCOUNTER — Encounter: Payer: Self-pay | Admitting: Physician Assistant

## 2020-10-17 ENCOUNTER — Ambulatory Visit (INDEPENDENT_AMBULATORY_CARE_PROVIDER_SITE_OTHER): Payer: 59

## 2020-10-17 DIAGNOSIS — Z23 Encounter for immunization: Secondary | ICD-10-CM

## 2020-10-17 NOTE — Progress Notes (Signed)
   Covid-19 Vaccination Clinic  Name:  Angelica Lee    MRN: [id number] DOB: 1976-10-09  10/17/2020  Ms. [name] was observed post Covid-19 immunization for 15 minutes without incident. She was provided with Vaccine Information Sheet and instruction to access the V-Safe system.   Ms. [name] was instructed to call 911 with any severe reactions post vaccine: Marland Kitchen Difficulty breathing  . Swelling of face and throat  . A fast heartbeat  . A bad rash all over body  . Dizziness and weakness

## 2020-12-04 ENCOUNTER — Other Ambulatory Visit: Payer: Self-pay | Admitting: Physician Assistant

## 2020-12-04 MED ORDER — AMPHETAMINE-DEXTROAMPHET ER 20 MG PO CP24: 20.0000 mg | ORAL_CAPSULE | ORAL | 0 refills | Status: DC

## 2021-01-10 ENCOUNTER — Other Ambulatory Visit: Payer: Self-pay | Admitting: Physician Assistant

## 2021-01-10 MED ORDER — AMPHETAMINE-DEXTROAMPHET ER 20 MG PO CP24: 20.0000 mg | ORAL_CAPSULE | ORAL | 0 refills | Status: DC

## 2021-01-10 MED ORDER — AMPHETAMINE-DEXTROAMPHET ER 20 MG PO CP24
20.0000 mg | ORAL_CAPSULE | ORAL | 0 refills | Status: DC
Start: 2021-03-11 — End: 2021-02-11

## 2021-01-10 NOTE — Telephone Encounter (Signed)
LAST APPOINTMENT DATE: 10/04/2020   NEXT APPOINTMENT DATE: 02/11/2021    LAST REFILL: 12/04/2020  QTY: 30

## 2021-02-05 ENCOUNTER — Encounter: Payer: Self-pay | Admitting: Obstetrics and Gynecology

## 2021-02-05 DIAGNOSIS — Z3041 Encounter for surveillance of contraceptive pills: Secondary | ICD-10-CM

## 2021-02-05 NOTE — Telephone Encounter (Signed)
Last annual exam 03/05/21. Next annual exam 03/13/21.

## 2021-02-06 MED ORDER — NORETHIN ACE-ETH ESTRAD-FE 1-20 MG-MCG PO TABS: 1.0000 | ORAL_TABLET | Freq: Every day | ORAL | 1 refills | Status: DC

## 2021-02-11 ENCOUNTER — Encounter: Payer: Self-pay | Admitting: Physician Assistant

## 2021-02-11 ENCOUNTER — Other Ambulatory Visit: Payer: Self-pay

## 2021-02-11 ENCOUNTER — Ambulatory Visit (INDEPENDENT_AMBULATORY_CARE_PROVIDER_SITE_OTHER): Payer: 59 | Admitting: Physician Assistant

## 2021-02-11 VITALS — BP 110/68 | HR 81 | Temp 98.2°F | Ht 63.0 in | Wt 119.4 lb

## 2021-02-11 DIAGNOSIS — F902 Attention-deficit hyperactivity disorder, combined type: Secondary | ICD-10-CM

## 2021-02-11 MED ORDER — AMPHETAMINE-DEXTROAMPHET ER 20 MG PO CP24: 20.0000 mg | ORAL_CAPSULE | Freq: Every day | ORAL | 0 refills | Status: DC

## 2021-02-11 NOTE — Progress Notes (Signed)
Angelica Lee is a 45 y.o. female is here for follow up.  I acted as a Neurosurgeon for [employer], PA-C Angelica Mull, LPN   History of Present Illness:   Chief Complaint  Patient presents with  . ADHD    HPI   ADHD Pt following up today, currently taking Adderall XR 20 mg daily. Pt tolerating medication well. Denies palpitations, insomnia, worsening mood, chest pain.   Health Maintenance Due  Topic Date Due  . Hepatitis C Screening  Never done    Past Medical History:  Diagnosis Date  . Amenorrhea      Social History   Tobacco Use  . Smoking status: Never Smoker  . Smokeless tobacco: Never Used  Vaping Use  . Vaping Use: Never used  Substance Use Topics  . Alcohol use: Yes    Comment: 4-5 days a week glass of wine a night   . Drug use: Never    Past Surgical History:  Procedure Laterality Date  . DILATION AND CURETTAGE OF UTERUS  2008    Family History  Problem Relation Age of Onset  . Hypertension Mother   . Hyperlipidemia Mother   . Hypertension Father   . ADD / ADHD Father   . Diabetes Sister        she is twin   . Breast cancer Maternal Grandmother 18    PMHx, SurgHx, SocialHx, FamHx, Medications, and Allergies were reviewed in the Visit Navigator and updated as appropriate.   Patient Active Problem List   Diagnosis Date Noted  . Rotator cuff syndrome of right shoulder 11/26/2017  . ADHD (attention deficit hyperactivity disorder) 11/02/2013    Social History   Tobacco Use  . Smoking status: Never Smoker  . Smokeless tobacco: Never Used  Vaping Use  . Vaping Use: Never used  Substance Use Topics  . Alcohol use: Yes    Comment: 4-5 days a week glass of wine a night   . Drug use: Never    Current Medications and Allergies:    Current Outpatient Medications:  .  amphetamine-dextroamphetamine (ADDERALL XR) 20 MG 24 hr capsule, Take 1 capsule (20 mg total) by mouth daily., Disp: 90 capsule, Rfl: 0 .  cholecalciferol (VITAMIN D3) 25  MCG (1000 UT) tablet, Take 1,000 Units by mouth daily., Disp: , Rfl:  .  norethindrone-ethinyl estradiol (LOESTRIN FE) 1-20 MG-MCG tablet, Take 1 tablet by mouth daily., Disp: 28 tablet, Rfl: 1  No Known Allergies  Review of Systems   ROS Negative unless otherwise specified per HPI.  Vitals:   Vitals:   02/11/21 0832  BP: 110/68  Pulse: 81  Temp: 98.2 F (36.8 C)  TempSrc: Temporal  SpO2: 99%  Weight: 119 lb 6.1 oz (54.2 kg)  Height: 5\' 3"  (1.6 m)     Body mass index is 21.15 kg/m.   Physical Exam:    Physical Exam Vitals and nursing note reviewed.  Constitutional:      General: She is not in acute distress.    Appearance: She is well-developed. She is not ill-appearing, toxic-appearing or sickly-appearing.  Cardiovascular:     Rate and Rhythm: Normal rate and regular rhythm.     Pulses: Normal pulses.     Heart sounds: Normal heart sounds, S1 normal and S2 normal.     Comments: No LE edema Pulmonary:     Effort: Pulmonary effort is normal.     Breath sounds: Normal breath sounds.  Skin:    General: Skin is  warm, dry and intact.  Neurological:     Mental Status: She is alert.     GCS: GCS eye subscore is 4. GCS verbal subscore is 5. GCS motor subscore is 6.  Psychiatric:        Mood and Affect: Mood and affect normal.        Speech: Speech normal.        Behavior: Behavior normal. Behavior is cooperative.      Assessment and Plan:    Angelica Lee was seen today for adhd.  Diagnoses and all orders for this visit:  Attention deficit hyperactivity disorder (ADHD), combined type  Other orders -     amphetamine-dextroamphetamine (ADDERALL XR) 20 MG 24 hr capsule; Take 1 capsule (20 mg total) by mouth daily.   No red flags on exam or history. Doing well on Adderall XR 20 mg daily. Drug contract signed today. Follow-up in 6 months.   CMA or LPN served as scribe during this visit. History, Physical, and Plan performed by medical provider. The above  documentation has been reviewed and is accurate and complete.  Jarold Motto, PA-C [place] 02/11/2021  Follow-up: No follow-ups on file.

## 2021-03-12 NOTE — Progress Notes (Signed)
45 y.o. [id number] Married White or Caucasian Not Hispanic or Latino female here for annual exam.  On cyclic OCP's not always having a cycle. No dyspareunia.  Period Cycle (Days): 60 Period Duration (Days): 4 Period Pattern: (!) Irregular Menstrual Flow: Light Menstrual Control: Tampon Menstrual Control Change Freq (Hours): 4 Dysmenorrhea: None  Patient's last menstrual period was 02/03/2021 (approximate).          Sexually active: Yes.    The current method of family planning is OCP (estrogen/progesterone).    Exercising: Yes.    Cardio Smoker:  no  Health Maintenance: Pap:   06/15/18 normal, negative hpv (at [facility])   History of abnormal Pap:  Yes years ago follow up was normal  MMG:  08/06/20 Bi-rads 1 neg  BMD:   None  Colonoscopy: 2011 was normal  TDaP:  06/24/16  Gardasil: none    reports that she has never smoked. She has never used smokeless tobacco. She reports current alcohol use. She reports that she does not use drugs. 3 glasses of wine over the weekend. 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 Kids are 83 (girl) and 31 (boy). Has a Advertising account planner.   Past Medical History:  Diagnosis Date  . Amenorrhea     Past Surgical History:  Procedure Laterality Date  . DILATION AND CURETTAGE OF UTERUS  2008    Current Outpatient Medications  Medication Sig Dispense Refill  . amphetamine-dextroamphetamine (ADDERALL XR) 20 MG 24 hr capsule Take 1 capsule (20 mg total) by mouth daily. 90 capsule 0  . cholecalciferol (VITAMIN D3) 25 MCG (1000 UT) tablet Take 1,000 Units by mouth daily.    . norethindrone-ethinyl estradiol (LOESTRIN FE) 1-20 MG-MCG tablet Take 1 tablet by mouth daily. 28 tablet 1   No current facility-administered medications for this visit.    Family History  Problem Relation Age of Onset  . Hypertension Mother   . Hyperlipidemia Mother   . Hypertension Father   . ADD / ADHD Father   . Diabetes Sister        she is twin   . Breast  cancer Maternal Grandmother 72  Dad has graves disease.   Review of Systems  All other systems reviewed and are negative.   Exam:   BP 130/60   Pulse 87   Ht 5\' 3"  (1.6 m)   Wt 119 lb (54 kg)   LMP 02/03/2021 (Approximate)   SpO2 100%   BMI 21.08 kg/m   Weight change: @WEIGHTCHANGE @ Height:   Height: 5\' 3"  (160 cm)  Ht Readings from Last 3 Encounters:  03/13/21 5\' 3"  (1.6 m)  02/11/21 5\' 3"  (1.6 m)  08/14/20 5\' 3"  (1.6 m)    General appearance: alert, cooperative and appears stated age Head: Normocephalic, without obvious abnormality, atraumatic Neck: no adenopathy, supple, symmetrical, trachea midline and thyroid normal to inspection and palpation Lungs: clear to auscultation bilaterally Cardiovascular: regular rate and rhythm Breasts: normal appearance, no masses or tenderness Abdomen: soft, non-tender; non distended,  no masses,  no organomegaly Extremities: extremities normal, atraumatic, no cyanosis or edema Skin: Skin color, texture, turgor normal. No rashes or lesions Lymph nodes: Cervical, supraclavicular, and axillary nodes normal. No abnormal inguinal nodes palpated Neurologic: Grossly normal   Pelvic: External genitalia:  no lesions              Urethra:  normal appearing urethra with no masses, tenderness or lesions  Bartholins and Skenes: normal                 Vagina: normal appearing vagina with normal color and discharge, no lesions              Cervix: no lesions               Bimanual Exam:  Uterus:  normal size, contour, position, consistency, mobility, non-tender and anteverted              Adnexa: no mass, fullness, tenderness               Rectovaginal: Confirms               Anus:  normal sphincter tone, no lesions  Carolynn Serve chaperoned for the exam.  1. Well woman exam Discussed breast self exam Discussed calcium and vit D intake No pap this year Labs with primary   2. Encounter for surveillance of contraceptive pills Doing  well - norethindrone-ethinyl estradiol (LOESTRIN FE) 1-20 MG-MCG tablet; Take 1 tablet by mouth daily.  Dispense: 84 tablet; Refill: 4

## 2021-03-13 ENCOUNTER — Encounter: Payer: Self-pay | Admitting: Obstetrics and Gynecology

## 2021-03-13 ENCOUNTER — Other Ambulatory Visit: Payer: Self-pay

## 2021-03-13 ENCOUNTER — Ambulatory Visit (INDEPENDENT_AMBULATORY_CARE_PROVIDER_SITE_OTHER): Payer: 59 | Admitting: Obstetrics and Gynecology

## 2021-03-13 VITALS — BP 130/60 | HR 87 | Ht 63.0 in | Wt 119.0 lb

## 2021-03-13 DIAGNOSIS — Z01419 Encounter for gynecological examination (general) (routine) without abnormal findings: Secondary | ICD-10-CM | POA: Diagnosis not present

## 2021-03-13 DIAGNOSIS — Z3041 Encounter for surveillance of contraceptive pills: Secondary | ICD-10-CM | POA: Diagnosis not present

## 2021-03-13 MED ORDER — NORETHIN ACE-ETH ESTRAD-FE 1-20 MG-MCG PO TABS: 1.0000 | ORAL_TABLET | Freq: Every day | ORAL | 4 refills | Status: DC

## 2021-03-13 NOTE — Patient Instructions (Signed)

## 2021-04-16 ENCOUNTER — Ambulatory Visit: Payer: 59 | Admitting: Physician Assistant

## 2021-04-23 ENCOUNTER — Telehealth: Payer: 59 | Admitting: Physician Assistant

## 2021-05-12 ENCOUNTER — Other Ambulatory Visit: Payer: Self-pay | Admitting: Physician Assistant

## 2021-05-13 MED ORDER — AMPHETAMINE-DEXTROAMPHET ER 20 MG PO CP24: 20.0000 mg | ORAL_CAPSULE | Freq: Every day | ORAL | 0 refills | Status: DC

## 2021-05-13 NOTE — Telephone Encounter (Signed)
Last refill: 02/11/21 #90, 0 Last OV: 02/11/21 dx. ADHD

## 2021-08-05 ENCOUNTER — Other Ambulatory Visit: Payer: Self-pay | Admitting: Family Medicine

## 2021-08-05 ENCOUNTER — Other Ambulatory Visit: Payer: Self-pay | Admitting: Physician Assistant

## 2021-08-05 MED ORDER — AMPHETAMINE-DEXTROAMPHET ER 20 MG PO CP24: 20.0000 mg | ORAL_CAPSULE | ORAL | 0 refills | Status: DC

## 2021-08-18 ENCOUNTER — Encounter: Payer: Self-pay | Admitting: Physician Assistant

## 2021-08-19 ENCOUNTER — Other Ambulatory Visit (HOSPITAL_BASED_OUTPATIENT_CLINIC_OR_DEPARTMENT_OTHER): Payer: Self-pay | Admitting: Physician Assistant

## 2021-08-19 DIAGNOSIS — Z1231 Encounter for screening mammogram for malignant neoplasm of breast: Secondary | ICD-10-CM

## 2021-09-15 ENCOUNTER — Other Ambulatory Visit: Payer: Self-pay

## 2021-09-15 ENCOUNTER — Ambulatory Visit (HOSPITAL_BASED_OUTPATIENT_CLINIC_OR_DEPARTMENT_OTHER)
Admission: RE | Admit: 2021-09-15 | Discharge: 2021-09-15 | Disposition: A | Discharge status: Home / Self Care | Payer: 59 | Source: Ambulatory Visit | Attending: Physician Assistant | Admitting: Physician Assistant

## 2021-09-15 ENCOUNTER — Encounter (HOSPITAL_BASED_OUTPATIENT_CLINIC_OR_DEPARTMENT_OTHER): Payer: Self-pay

## 2021-09-15 DIAGNOSIS — Z1231 Encounter for screening mammogram for malignant neoplasm of breast: Secondary | ICD-10-CM | POA: Diagnosis not present

## 2021-09-15 NOTE — Progress Notes (Signed)
Subjective:    Angelica Lee is a 45 y.o. female and is here for a comprehensive physical exam.   HPI  Health Maintenance Due  Topic Date Due   Hepatitis C Screening  Never done   INFLUENZA VACCINE  07/14/2021    Acute Concerns: None discussed at this time.   Chronic Issues: ADHD Angelica Lee is currently taking adderall XR 20 mg with no adverse effects. Denies concerns with insomnia, palpitations, worsening anxiety.  Vitamin D Deficiency  Angelica Lee has been compliant with vitamin D3 25 mcg on most days with no adverse effects.   Health Maintenance: Immunizations -- COVID- Last completed 10/17/20 Proofreader) Tdap- UTD, Last completed 06/24/16 Influenza Vaccine- Last completed 09/16/21.  Colonoscopy -- N/A Mammogram -- UTD, Last completed 09/15/21. PAP -- UTD, Last completed 09/22/18. Dentistry- Sees one regularly.  Opthalmology- Sees one regularly.  Diet -- Ice cream and wine are her weakness, but has a normal diet.  Sleep habits -- Sleeping well but goes to sleep late only to wake up early.  Exercise -- Regular walks with her dog, about 25 minutes, but admits she could do better.  Weight -- Stable  Wt Readings from Last 3 Encounters:  09/16/21 119 lb (54 kg)  03/13/21 119 lb (54 kg)  02/11/21 119 lb 6.1 oz (54.2 kg)   Mood -- Experiences situational stress due to family but is managing well.  Alcohol use: 1 glass of wine 4-5 days x week  Tobacco use:  Tobacco Use: Low Risk    Smoking Tobacco Use: Never   Smokeless Tobacco Use: Never     Depression screen PHQ 2/9 09/16/2021  Decreased Interest 0  Down, Depressed, Hopeless 0  PHQ - 2 Score 0  Altered sleeping -  Tired, decreased energy -  Change in appetite -  Feeling bad or failure about yourself  -  Trouble concentrating -  Moving slowly or fidgety/restless -  Suicidal thoughts -  PHQ-9 Score -  Difficult doing work/chores -     Other providers/specialists: Patient Care Team: Jarold Motto, [place] as PCP -  General (Physician Assistant)    PMHx, SurgHx, SocialHx, Medications, and Allergies were reviewed in the Visit Navigator and updated as appropriate.   Past Medical History:  Diagnosis Date   Amenorrhea      Past Surgical History:  Procedure Laterality Date   DILATION AND CURETTAGE OF UTERUS  2008     Family History  Problem Relation Age of Onset   Hypertension Mother    Hyperlipidemia Mother    Hypertension Father    ADD / ADHD Father    Diabetes Sister        she is twin    Breast cancer Maternal Grandmother 13   Colon cancer Paternal Grandfather 33       possible    Social History   Tobacco Use   Smoking status: Never   Smokeless tobacco: Never  Vaping Use   Vaping Use: Never used  Substance Use Topics   Alcohol use: Yes    Comment: 4-5 days a week glass of wine a night    Drug use: Never    Review of Systems:   Review of Systems  Constitutional:  Negative for chills, fever, malaise/fatigue and weight loss.  HENT:  Negative for hearing loss, sinus pain and sore throat.   Respiratory:  Negative for cough and hemoptysis.   Cardiovascular:  Negative for chest pain, palpitations, leg swelling and PND.  Gastrointestinal:  Negative for abdominal  pain, constipation, diarrhea, heartburn, nausea and vomiting.  Genitourinary:  Negative for dysuria, frequency and urgency.  Musculoskeletal:  Negative for back pain, myalgias and neck pain.  Skin:  Negative for itching and rash.  Neurological:  Negative for dizziness, tingling, seizures and headaches.  Endo/Heme/Allergies:  Negative for polydipsia.  Psychiatric/Behavioral:  Negative for depression. The patient is not nervous/anxious.     Objective:   BP 110/70 (BP Location: Left Arm, Patient Position: Sitting, Cuff Size: Normal)   Pulse 92   Temp 98.3 F (36.8 C) (Temporal)   Ht 5\' 3"  (1.6 m)   Wt 119 lb (54 kg)   LMP 09/15/2021   SpO2 100%   BMI 21.08 kg/m  Body mass index is 21.08 kg/m.   General  Appearance:    Alert, cooperative, no distress, appears stated age  Head:    Normocephalic, without obvious abnormality, atraumatic  Eyes:    PERRL, conjunctiva/corneas clear, EOM's intact, fundi    benign, both eyes  Ears:    Normal TM's and external ear canals, both ears  Nose:   Nares normal, septum midline, mucosa normal, no drainage    or sinus tenderness  Throat:   Lips, mucosa, and tongue normal; teeth and gums normal  Neck:   Supple, symmetrical, trachea midline, no adenopathy;    thyroid:  no enlargement/tenderness/nodules; no carotid   bruit or JVD  Back:     Symmetric, no curvature, ROM normal, no CVA tenderness  Lungs:     Clear to auscultation bilaterally, respirations unlabored  Chest Wall:    No tenderness or deformity   Heart:    Regular rate and rhythm, S1 and S2 normal, no murmur, rub or gallop  Breast Exam:    deferred  Abdomen:     Soft, non-tender, bowel sounds active all four quadrants,    no masses, no organomegaly  Genitalia:    Deferred  Extremities:   Extremities normal, atraumatic, no cyanosis or edema  Pulses:   2+ and symmetric all extremities  Skin:   Skin color, texture, turgor normal, no rashes or lesions  Lymph nodes:   Cervical, supraclavicular, and axillary nodes normal  Neurologic:   CNII-XII intact, normal strength, sensation and reflexes    throughout    Assessment/Plan:   Routine physical examination Today patient counseled on age appropriate routine health concerns for screening and prevention, each reviewed and up to date or declined. Immunizations reviewed and up to date or declined. Labs ordered and reviewed. Risk factors for depression reviewed and negative. Hearing function and visual acuity are intact. ADLs screened and addressed as needed. Functional ability and level of safety reviewed and appropriate. Education, counseling and referrals performed based on assessed risks today. Patient provided with a copy of personalized plan for  preventive services.  Attention deficit hyperactivity disorder (ADHD), combined type Well controlled Continue Adderall 20 mg XR daily Follow-up in 6 months, sooner if concerns  Vitamin D deficiency Update Vit D and provide recommendations accordingly  Screening for lipid disorders Update lipid panel and provide recommendations accordingly  Encounter for screening for other viral diseases Hep C ordered today for screening   Patient Counseling: [x]    Nutrition: Stressed importance of moderation in sodium/caffeine intake, saturated fat and cholesterol, caloric balance, sufficient intake of fresh fruits, vegetables, fiber, calcium, iron, and 1 mg of folate supplement per day (for females capable of pregnancy).  [x]    Stressed the importance of regular exercise.   [x]    Substance Abuse: Discussed cessation/primary  prevention of tobacco, alcohol, or other drug use; driving or other dangerous activities under the influence; availability of treatment for abuse.   [x]    Injury prevention: Discussed safety belts, safety helmets, smoke detector, smoking near bedding or upholstery.   [x]    Sexuality: Discussed sexually transmitted diseases, partner selection, use of condoms, avoidance of unintended pregnancy  and contraceptive alternatives.  [x]    Dental health: Discussed importance of regular tooth brushing, flossing, and dental visits.  [x]    Health maintenance and immunizations reviewed. Please refer to Health maintenance section.   I,Havlyn C Ratchford,acting as a for , PA.,have documented all relevant documentation on the behalf of , PA,as directed by  , PA while in the presence of Neurosurgeon, [employer].   I, Jarold Motto, Jarold Motto, have reviewed all documentation for this visit. The documentation on 09/16/21 for the exam, diagnosis, procedures, and orders are all accurate and complete.   [place], PA-C [place] [facility]

## 2021-09-16 ENCOUNTER — Encounter: Payer: Self-pay | Admitting: Physician Assistant

## 2021-09-16 ENCOUNTER — Ambulatory Visit (INDEPENDENT_AMBULATORY_CARE_PROVIDER_SITE_OTHER): Payer: 59 | Admitting: Physician Assistant

## 2021-09-16 VITALS — BP 110/70 | HR 92 | Temp 98.3°F | Ht 63.0 in | Wt 119.0 lb

## 2021-09-16 DIAGNOSIS — F902 Attention-deficit hyperactivity disorder, combined type: Secondary | ICD-10-CM

## 2021-09-16 DIAGNOSIS — Z0000 Encounter for general adult medical examination without abnormal findings: Secondary | ICD-10-CM

## 2021-09-16 DIAGNOSIS — Z1159 Encounter for screening for other viral diseases: Secondary | ICD-10-CM

## 2021-09-16 DIAGNOSIS — E559 Vitamin D deficiency, unspecified: Secondary | ICD-10-CM

## 2021-09-16 DIAGNOSIS — Z13220 Encounter for screening for lipoid disorders: Secondary | ICD-10-CM | POA: Diagnosis not present

## 2021-09-16 DIAGNOSIS — Z23 Encounter for immunization: Secondary | ICD-10-CM | POA: Diagnosis not present

## 2021-09-16 LAB — LIPID PANEL
Cholesterol: 189 mg/dL (ref 0–200)
HDL: 80.8 mg/dL (ref >39.00)
LDL Cholesterol: 94 mg/dL (ref 0–99)
NonHDL: 107.87
Total CHOL/HDL Ratio: 2
Triglycerides: 67 mg/dL (ref 0.0–149.0)
VLDL: 13.4 mg/dL (ref 0.0–40.0)

## 2021-09-16 LAB — CBC WITH DIFFERENTIAL/PLATELET
Basophils Absolute: 0 10*3/uL (ref 0.0–0.1)
Basophils Relative: 0.7 % (ref 0.0–3.0)
Eosinophils Absolute: 0.1 10*3/uL (ref 0.0–0.7)
Eosinophils Relative: 3.1 % (ref 0.0–5.0)
HCT: 41.2 % (ref 36.0–46.0)
Hemoglobin: 13.6 g/dL (ref 12.0–15.0)
Lymphocytes Relative: 27.3 % (ref 12.0–46.0)
Lymphs Abs: 1.1 10*3/uL (ref 0.7–4.0)
MCHC: 33 g/dL (ref 30.0–36.0)
MCV: 91.5 fl (ref 78.0–100.0)
Monocytes Absolute: 0.3 10*3/uL (ref 0.1–1.0)
Monocytes Relative: 7.5 % (ref 3.0–12.0)
Neutro Abs: 2.5 10*3/uL (ref 1.4–7.7)
Neutrophils Relative %: 61.4 % (ref 43.0–77.0)
Platelets: 212 10*3/uL (ref 150.0–400.0)
RBC: 4.51 Mil/uL (ref 3.87–5.11)
RDW: 13.3 % (ref 11.5–15.5)
WBC: 4.1 10*3/uL (ref 4.0–10.5)

## 2021-09-16 LAB — COMPREHENSIVE METABOLIC PANEL
ALT: 14 U/L (ref 0–35)
AST: 16 U/L (ref 0–37)
Albumin: 4.3 g/dL (ref 3.5–5.2)
Alkaline Phosphatase: 33 U/L — ABNORMAL LOW (ref 39–117)
BUN: 16 mg/dL (ref 6–23)
CO2: 27 mEq/L (ref 19–32)
Calcium: 9.3 mg/dL (ref 8.4–10.5)
Chloride: 104 mEq/L (ref 96–112)
Creatinine, Ser: 0.68 mg/dL (ref 0.40–1.20)
GFR: 107.03 mL/min (ref >60.00)
Glucose, Bld: 93 mg/dL (ref 70–99)
Potassium: 4.2 mEq/L (ref 3.5–5.1)
Sodium: 138 mEq/L (ref 135–145)
Total Bilirubin: 0.7 mg/dL (ref 0.2–1.2)
Total Protein: 7.2 g/dL (ref 6.0–8.3)

## 2021-09-16 LAB — VITAMIN D 25 HYDROXY (VIT D DEFICIENCY, FRACTURES): VITD: 28.5 ng/mL — ABNORMAL LOW (ref 30.00–100.00)

## 2021-09-16 MED ORDER — AMPHETAMINE-DEXTROAMPHET ER 20 MG PO CP24: 20.0000 mg | ORAL_CAPSULE | ORAL | 0 refills | Status: DC

## 2021-09-16 NOTE — Patient Instructions (Addendum)
It was great to see you!  Please get your COVID bi-valent booster when you can at local [facility] or [facility]  Please go to the lab for blood work.   Our office will call you with your results unless you have chosen to receive results via MyChart.  If your blood work is normal we will follow-up each year for physicals and as scheduled for chronic medical problems.  If anything is abnormal we will treat accordingly and get you in for a follow-up.  Take care,  Angelica Lee

## 2021-09-17 LAB — HEPATITIS C ANTIBODY
Hepatitis C Ab: NONREACTIVE
SIGNAL TO CUT-OFF: 0.01 (ref <1.00)

## 2021-10-20 ENCOUNTER — Other Ambulatory Visit: Payer: Self-pay | Admitting: Physician Assistant

## 2021-10-21 MED ORDER — AMPHETAMINE-DEXTROAMPHET ER 20 MG PO CP24: 20.0000 mg | ORAL_CAPSULE | ORAL | 0 refills | Status: DC

## 2021-10-21 NOTE — Telephone Encounter (Signed)
Angelica Lee, pt wanted a 90 day supply.

## 2022-01-19 ENCOUNTER — Other Ambulatory Visit: Payer: Self-pay | Admitting: Physician Assistant

## 2022-01-20 ENCOUNTER — Other Ambulatory Visit: Payer: Self-pay | Admitting: Physician Assistant

## 2022-01-20 MED ORDER — AMPHETAMINE-DEXTROAMPHET ER 20 MG PO CP24: 20.0000 mg | ORAL_CAPSULE | ORAL | 0 refills | Status: DC

## 2022-03-09 NOTE — Progress Notes (Signed)
46 y.o. [id number] Married White or Caucasian Not Hispanic or Latino female here for annual exam.  She is still having irregular periods on OCP's. She takes the pill cyclically, doesn't always get a cycle. When she gets a cycle it's light. No dyspareunia.  ?Period Duration (Days): 4-5 ?Period Pattern: (!) Irregular ?Menstrual Flow: Light ?Menstrual Control: Tampon (light tampon) ?Menstrual Control Change Freq (Hours): 4-5 ?Dysmenorrhea: None ? ?Patient's last menstrual period was 03/02/2022.          ?Sexually active: Yes.    ?The current method of family planning is OCP (estrogen/progesterone).    ?Exercising: Yes.     Walking  ?Smoker:  no ? ?Health Maintenance: ?Pap:  06/15/18 normal, negative hpv (at [facility])   ?History of abnormal Pap:  yes follow up was normal  ?MMG:  09/19/21 density C Bi-rads 1 neg  ?BMD:   none  ?Colonoscopy: 2011 was normal  ?TDaP:  06/24/16  ?Gardasil: none  ? ? reports that she has never smoked. She has never used smokeless tobacco. She reports current alcohol use. She reports that she does not use drugs. Has 3 drinks a week. 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 Kids are 39 (girl) and 4 (boy). Has a Advertising account planner.  ? ?Past Medical History:  ?Diagnosis Date  ? Amenorrhea   ? ? ?Past Surgical History:  ?Procedure Laterality Date  ? DILATION AND CURETTAGE OF UTERUS  2008  ? ? ?Current Outpatient Medications  ?Medication Sig Dispense Refill  ? cholecalciferol (VITAMIN D3) 25 MCG (1000 UT) tablet Take 1,000 Units by mouth daily.    ? fluticasone (FLONASE) 50 MCG/ACT nasal spray Place into both nostrils daily.    ? norethindrone-ethinyl estradiol (LOESTRIN FE) 1-20 MG-MCG tablet Take 1 tablet by mouth daily. 84 tablet 4  ? amphetamine-dextroamphetamine (ADDERALL XR) 20 MG 24 hr capsule Take 1 capsule (20 mg total) by mouth every morning. 90 capsule 0  ? ?No current facility-administered medications for this visit.  ? ? ?Family History  ?Problem Relation Age of Onset  ?  Hypertension Mother   ? Hyperlipidemia Mother   ? Hypertension Father   ? ADD / ADHD Father   ? Diabetes Sister   ?     she is twin   ? Breast cancer Maternal Grandmother 93  ? Colon cancer Paternal Grandfather 72  ?     possible  ? ? ?Review of Systems  ?All other systems reviewed and are negative. ? ?Exam:   ?BP 122/68   Pulse 77   Ht 5\' 3"  (1.6 m)   Wt 118 lb (53.5 kg)   LMP 03/02/2022   SpO2 100%   BMI 20.90 kg/m?   Weight change: @WEIGHTCHANGE @ Height:   Height: 5\' 3"  (160 cm)  ?Ht Readings from Last 3 Encounters:  ?03/16/22 5\' 3"  (1.6 m)  ?09/16/21 5\' 3"  (1.6 m)  ?03/13/21 5\' 3"  (1.6 m)  ? ? ?General appearance: alert, cooperative and appears stated age ?Head: Normocephalic, without obvious abnormality, atraumatic ?Neck: no adenopathy, supple, symmetrical, trachea midline and thyroid normal to inspection and palpation ?Lungs: clear to auscultation bilaterally ?Cardiovascular: regular rate and rhythm ?Breasts: normal appearance, no masses or tenderness ?Abdomen: soft, non-tender; non distended,  no masses,  no organomegaly ?Extremities: extremities normal, atraumatic, no cyanosis or edema ?Skin: Skin color, texture, turgor normal. No rashes or lesions ?Lymph nodes: Cervical, supraclavicular, and axillary nodes normal. ?No abnormal inguinal nodes palpated ?Neurologic: Grossly normal ? ? ?Pelvic: External genitalia:  no lesions ?  Urethra:  normal appearing urethra with no masses, tenderness or lesions ?             Bartholins and Skenes: normal    ?             Vagina: normal appearing vagina with normal color and discharge, no lesions ?             Cervix: no lesions ?              ?Bimanual Exam:  Uterus:  normal size, contour, position, consistency, mobility, non-tender ?             Adnexa: no mass, fullness, tenderness ?              Rectovaginal: Confirms ?              Anus:  normal sphincter tone, no lesions ? ? ?1. Well woman exam ?Discussed breast self exam ?Discussed calcium and vit D  intake ?Pap next year ?Mammogram in the fall ?Labs UTD ? ?2. Encounter for surveillance of contraceptive pills ?Doing well ?- norethindrone-ethinyl estradiol-FE (LOESTRIN FE) 1-20 MG-MCG tablet; Take 1 tablet by mouth daily.  Dispense: 84 tablet; Refill: 4 ? ? ?

## 2022-03-14 ENCOUNTER — Encounter: Payer: Self-pay | Admitting: Obstetrics and Gynecology

## 2022-03-16 ENCOUNTER — Encounter: Payer: Self-pay | Admitting: Obstetrics and Gynecology

## 2022-03-16 ENCOUNTER — Ambulatory Visit (INDEPENDENT_AMBULATORY_CARE_PROVIDER_SITE_OTHER): Payer: 59 | Admitting: Obstetrics and Gynecology

## 2022-03-16 VITALS — BP 122/68 | HR 77 | Ht 63.0 in | Wt 118.0 lb

## 2022-03-16 DIAGNOSIS — Z01419 Encounter for gynecological examination (general) (routine) without abnormal findings: Secondary | ICD-10-CM | POA: Diagnosis not present

## 2022-03-16 DIAGNOSIS — Z3041 Encounter for surveillance of contraceptive pills: Secondary | ICD-10-CM

## 2022-03-16 MED ORDER — NORETHIN ACE-ETH ESTRAD-FE 1-20 MG-MCG PO TABS: 1.0000 | ORAL_TABLET | Freq: Every day | ORAL | 4 refills | Status: DC

## 2022-03-16 NOTE — Patient Instructions (Signed)

## 2022-04-21 ENCOUNTER — Other Ambulatory Visit: Payer: Self-pay | Admitting: Physician Assistant

## 2022-04-22 MED ORDER — AMPHETAMINE-DEXTROAMPHET ER 20 MG PO CP24: 20.0000 mg | ORAL_CAPSULE | ORAL | 0 refills | Status: DC

## 2022-07-20 ENCOUNTER — Other Ambulatory Visit: Payer: Self-pay | Admitting: Physician Assistant

## 2022-07-22 ENCOUNTER — Encounter: Payer: Self-pay | Admitting: Physician Assistant

## 2022-07-22 ENCOUNTER — Encounter (INDEPENDENT_AMBULATORY_CARE_PROVIDER_SITE_OTHER): Payer: Self-pay

## 2022-07-22 ENCOUNTER — Ambulatory Visit (INDEPENDENT_AMBULATORY_CARE_PROVIDER_SITE_OTHER): Payer: 59 | Admitting: Physician Assistant

## 2022-07-22 VITALS — BP 100/66 | HR 89 | Temp 98.6°F | Ht 63.0 in | Wt 118.2 lb

## 2022-07-22 DIAGNOSIS — F902 Attention-deficit hyperactivity disorder, combined type: Secondary | ICD-10-CM | POA: Diagnosis not present

## 2022-07-22 DIAGNOSIS — F411 Generalized anxiety disorder: Secondary | ICD-10-CM

## 2022-07-22 MED ORDER — AMPHETAMINE-DEXTROAMPHET ER 20 MG PO CP24: 20.0000 mg | ORAL_CAPSULE | ORAL | 0 refills | Status: DC

## 2022-07-22 NOTE — Progress Notes (Signed)
Angelica Lee is a 46 y.o. female here for a follow up on ADHD.    History of Present Illness:   Chief Complaint  Patient presents with   ADHD    HPI  ADHD; GAD Patient is currently taking Adderall XR 20 mg with no adverse side effects. Tolerating this well with no complications. She has been having increased anxiety due to work. Symptoms has gotten worse for the past 3 months. Patient states she has been dealing with forgetting things such as her car keys. Has not been sleeping well at night due to work since she has to complete her work at night. She is compliant with her medication. States she takes this even on holidays. States she feels "sluggish" if she does not take her Adderall. She is requesting refill on Adderall today. Denies SI/HI or any other concerns.     Past Medical History:  Diagnosis Date   Amenorrhea      Social History   Tobacco Use   Smoking status: Never   Smokeless tobacco: Never  Vaping Use   Vaping Use: Never used  Substance Use Topics   Alcohol use: Yes    Comment: 4-5 days a week glass of wine a night    Drug use: Never    Past Surgical History:  Procedure Laterality Date   DILATION AND CURETTAGE OF UTERUS  2008    Family History  Problem Relation Age of Onset   Hypertension Mother    Hyperlipidemia Mother    Hypertension Father    ADD / ADHD Father    Diabetes Sister        she is twin    Breast cancer Maternal Grandmother 14   Colon cancer Paternal Grandfather 70       possible    No Known Allergies  Current Medications:   Current Outpatient Medications:    cholecalciferol (VITAMIN D3) 25 MCG (1000 UT) tablet, Take 1,000 Units by mouth daily., Disp: , Rfl:    fluticasone (FLONASE) 50 MCG/ACT nasal spray, Place into both nostrils daily., Disp: , Rfl:    norethindrone-ethinyl estradiol-FE (LOESTRIN FE) 1-20 MG-MCG tablet, Take 1 tablet by mouth daily., Disp: 84 tablet, Rfl: 4   amphetamine-dextroamphetamine (ADDERALL XR) 20 MG 24  hr capsule, Take 1 capsule (20 mg total) by mouth every morning., Disp: 90 capsule, Rfl: 0   Review of Systems:   ROS Negative unless otherwise specified per HPI.   Vitals:   Vitals:   07/22/22 1521  BP: 100/66  Pulse: 89  Temp: 98.6 F (37 C)  TempSrc: Temporal  SpO2: 99%  Weight: 118 lb 4 oz (53.6 kg)  Height: 5\' 3"  (1.6 m)     Body mass index is 20.95 kg/m.  Physical Exam:   Physical Exam Vitals and nursing note reviewed.  Constitutional:      General: She is not in acute distress.    Appearance: She is well-developed. She is not ill-appearing or toxic-appearing.  Cardiovascular:     Rate and Rhythm: Normal rate and regular rhythm.     Pulses: Normal pulses.     Heart sounds: Normal heart sounds, S1 normal and S2 normal.  Pulmonary:     Effort: Pulmonary effort is normal.     Breath sounds: Normal breath sounds.  Skin:    General: Skin is warm and dry.  Neurological:     Mental Status: She is alert.     GCS: GCS eye subscore is 4. GCS verbal subscore is 5.  GCS motor subscore is 6.  Psychiatric:        Speech: Speech normal.        Behavior: Behavior normal. Behavior is cooperative.     Assessment and Plan:   Attention deficit hyperactivity disorder (ADHD), combined type Well controlled overall Continue Adderall 20 mg XR daily Follow-up in 3 months for CPE  GAD (generalized anxiety disorder) Uncontrolled Declines medication She is going to reach out to [facility] I discussed with patient that if they develop any SI, to tell someone immediately and seek medical attention. Follow-up in 3 months, sooner if concerns  I,Savera Zaman,acting as a scribe for [employer], PA.,have documented all relevant documentation on the behalf of Jarold Motto, PA,as directed by  Jarold Motto, PA while in the presence of Jarold Motto, [place].    I, Jarold Motto, [place], have reviewed all documentation for this visit. The documentation on 07/22/22 for the exam,  diagnosis, procedures, and orders are all accurate and complete.   Jarold Motto, PA-C

## 2022-08-04 ENCOUNTER — Other Ambulatory Visit: Payer: Self-pay

## 2022-08-04 ENCOUNTER — Encounter: Payer: Self-pay | Admitting: Physician Assistant

## 2022-08-04 ENCOUNTER — Other Ambulatory Visit: Payer: 59

## 2022-08-04 DIAGNOSIS — Z021 Encounter for pre-employment examination: Secondary | ICD-10-CM

## 2022-08-04 NOTE — Progress Notes (Unsigned)
Screening test ordered by employer, [employer].  OK per Dr Sedalia Muta.

## 2022-08-08 LAB — QUANTIFERON-TB GOLD PLUS
QuantiFERON Mitogen Value: >10 IU/mL
QuantiFERON Nil Value: 0.01 IU/mL
QuantiFERON TB1 Ag Value: 0.02 IU/mL
QuantiFERON TB2 Ag Value: 0.01 IU/mL
QuantiFERON-TB Gold Plus: NEGATIVE

## 2022-08-08 LAB — DRUG SCREEN 10 W/CONF, SERUM
Amphetamines, IA: NEGATIVE ng/mL
Barbiturates, IA: NEGATIVE ug/mL
Benzodiazepines, IA: NEGATIVE ng/mL
Cocaine & Metabolite, IA: NEGATIVE ng/mL
Methadone, IA: NEGATIVE ng/mL
Opiates, IA: NEGATIVE ng/mL
Oxycodones, IA: NEGATIVE ng/mL
Phencyclidine, IA: NEGATIVE ng/mL
Propoxyphene, IA: NEGATIVE ng/mL
THC(Marijuana) Metabolite, IA: NEGATIVE ng/mL

## 2022-09-07 ENCOUNTER — Encounter: Payer: Self-pay | Admitting: *Deleted

## 2022-09-16 ENCOUNTER — Other Ambulatory Visit (HOSPITAL_BASED_OUTPATIENT_CLINIC_OR_DEPARTMENT_OTHER): Payer: Self-pay | Admitting: Physician Assistant

## 2022-09-16 ENCOUNTER — Telehealth (HOSPITAL_BASED_OUTPATIENT_CLINIC_OR_DEPARTMENT_OTHER): Payer: Self-pay

## 2022-09-16 DIAGNOSIS — Z1231 Encounter for screening mammogram for malignant neoplasm of breast: Secondary | ICD-10-CM

## 2022-09-22 ENCOUNTER — Encounter: Payer: 59 | Admitting: Physician Assistant

## 2022-09-22 ENCOUNTER — Encounter: Payer: Self-pay | Admitting: Physician Assistant

## 2022-09-22 ENCOUNTER — Ambulatory Visit (INDEPENDENT_AMBULATORY_CARE_PROVIDER_SITE_OTHER): Payer: 59 | Admitting: Physician Assistant

## 2022-09-22 VITALS — BP 104/60 | HR 77 | Temp 98.0°F | Ht 63.0 in | Wt 118.0 lb

## 2022-09-22 DIAGNOSIS — H029 Unspecified disorder of eyelid: Secondary | ICD-10-CM | POA: Diagnosis not present

## 2022-09-22 DIAGNOSIS — Z0001 Encounter for general adult medical examination with abnormal findings: Secondary | ICD-10-CM | POA: Diagnosis not present

## 2022-09-22 DIAGNOSIS — Z13220 Encounter for screening for lipoid disorders: Secondary | ICD-10-CM | POA: Diagnosis not present

## 2022-09-22 DIAGNOSIS — F902 Attention-deficit hyperactivity disorder, combined type: Secondary | ICD-10-CM | POA: Diagnosis not present

## 2022-09-22 LAB — CBC WITH DIFFERENTIAL/PLATELET
Basophils Absolute: 0 10*3/uL (ref 0.0–0.1)
Basophils Relative: 1.1 % (ref 0.0–3.0)
Eosinophils Absolute: 0.1 10*3/uL (ref 0.0–0.7)
Eosinophils Relative: 4.1 % (ref 0.0–5.0)
HCT: 41.9 % (ref 36.0–46.0)
Hemoglobin: 13.8 g/dL (ref 12.0–15.0)
Lymphocytes Relative: 43.9 % (ref 12.0–46.0)
Lymphs Abs: 1.5 10*3/uL (ref 0.7–4.0)
MCHC: 33 g/dL (ref 30.0–36.0)
MCV: 92.5 fl (ref 78.0–100.0)
Monocytes Absolute: 0.3 10*3/uL (ref 0.1–1.0)
Monocytes Relative: 9.4 % (ref 3.0–12.0)
Neutro Abs: 1.4 10*3/uL (ref 1.4–7.7)
Neutrophils Relative %: 41.5 % — ABNORMAL LOW (ref 43.0–77.0)
Platelets: 196 10*3/uL (ref 150.0–400.0)
RBC: 4.53 Mil/uL (ref 3.87–5.11)
RDW: 12.8 % (ref 11.5–15.5)
WBC: 3.4 10*3/uL — ABNORMAL LOW (ref 4.0–10.5)

## 2022-09-22 LAB — COMPREHENSIVE METABOLIC PANEL
ALT: 16 U/L (ref 0–35)
AST: 19 U/L (ref 0–37)
Albumin: 4.4 g/dL (ref 3.5–5.2)
Alkaline Phosphatase: 39 U/L (ref 39–117)
BUN: 18 mg/dL (ref 6–23)
CO2: 25 mEq/L (ref 19–32)
Calcium: 9.4 mg/dL (ref 8.4–10.5)
Chloride: 104 mEq/L (ref 96–112)
Creatinine, Ser: 0.68 mg/dL (ref 0.40–1.20)
GFR: 106.26 mL/min (ref >60.00)
Glucose, Bld: 89 mg/dL (ref 70–99)
Potassium: 3.7 mEq/L (ref 3.5–5.1)
Sodium: 137 mEq/L (ref 135–145)
Total Bilirubin: 0.4 mg/dL (ref 0.2–1.2)
Total Protein: 7.6 g/dL (ref 6.0–8.3)

## 2022-09-22 LAB — LIPID PANEL
Cholesterol: 201 mg/dL — ABNORMAL HIGH (ref 0–200)
HDL: 86.8 mg/dL (ref >39.00)
LDL Cholesterol: 106 mg/dL — ABNORMAL HIGH (ref 0–99)
NonHDL: 114.15
Total CHOL/HDL Ratio: 2
Triglycerides: 41 mg/dL (ref 0.0–149.0)
VLDL: 8.2 mg/dL (ref 0.0–40.0)

## 2022-09-22 NOTE — Progress Notes (Signed)
Subjective:    Angelica Lee is a 46 y.o. female and is here for a comprehensive physical exam.  HPI  Health Maintenance Due  Topic Date Due   MAMMOGRAM  09/15/2022    Acute Concerns: Eyelid lesion: She reports of a little eye pimple/rash on her right eye, but it is not a serious problem.  She is wondering if she can see a dermatology. Area seems to not be going away. Not painful.  Chronic Issues: ADHD: She reports that her ADHD is well controled with her medication. She reports that she is going to counseling now. Currently taking Adderall 20 mg XR daily.  Health Maintenance: Social History-- Her father was recently diagnosed with Graves' disease. She denies any cancer in her family. She is a occasional drinker.  Immunizations -- She is up to date with her influenza vaccine.  Mammogram -- Last completed on 09/15/2021. She is currently scheduling for annual mammogram. PAP -- Last completed on 09/22/2018.  Diet -- She is maintaining a healthy diet.   Exercise -- She is regularly exercising.   Sleep habits -- She has been sleeping good. 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   UTD with dentist? - She is UTD with the dentist. UTD with eye doctor? - She is UTD with eye doctor.   Weight history: Wt Readings from Last 10 Encounters:  09/22/22 118 lb (53.5 kg)  07/22/22 118 lb 4 oz (53.6 kg)  03/16/22 118 lb (53.5 kg)  09/16/21 119 lb (54 kg)  03/13/21 119 lb (54 kg)  02/11/21 119 lb 6.1 oz (54.2 kg)  08/14/20 118 lb 6.1 oz (53.7 kg)  03/05/20 117 lb (53.1 kg)  02/12/20 121 lb 6.1 oz (55.1 kg)  11/27/19 118 lb (53.5 kg)   Body mass index is 20.9 kg/m. Patient's last menstrual period was 08/21/2022 (approximate).  Alcohol use:  reports current alcohol use.  Tobacco use:  Tobacco Use: Low Risk  (09/22/2022)   Patient History    Smoking Tobacco Use: Never    Smokeless Tobacco Use: Never    Passive Exposure:  Not on file   Eligible for lung cancer screening? No     09/22/2022   10:03 AM  Depression screen PHQ 2/9  Decreased Interest 0  Down, Depressed, Hopeless 0  PHQ - 2 Score 0     Other providers/specialists: Patient Care Team: Jarold Motto, [place] as PCP - General (Physician Assistant)    PMHx, SurgHx, SocialHx, Medications, and Allergies were reviewed in the Visit Navigator and updated as appropriate.   Past Medical History:  Diagnosis Date   Amenorrhea      Past Surgical History:  Procedure Laterality Date   DILATION AND CURETTAGE OF UTERUS  2008     Family History  Problem Relation Age of Onset   Hypertension Mother    Hyperlipidemia Mother    Hypertension Father    ADD / ADHD Father    Graves' disease Father    Diabetes Sister        she is twin    Breast cancer Maternal Grandmother 43   Colon cancer Paternal Grandfather 80       possible    Social History   Tobacco Use   Smoking status: Never   Smokeless tobacco: Never  Vaping Use   Vaping Use: Never used  Substance Use Topics   Alcohol use: Yes    Comment: 4-5 days a week  glass of wine a night    Drug use: Never    Review of Systems:   Review of Systems  Constitutional:  Negative for chills, fever, malaise/fatigue and weight loss.  HENT:  Negative for hearing loss, sinus pain and sore throat.   Respiratory:  Negative for cough and hemoptysis.   Cardiovascular:  Negative for chest pain, palpitations, leg swelling and PND.  Gastrointestinal:  Negative for abdominal pain, constipation, diarrhea, heartburn, nausea and vomiting.  Genitourinary:  Negative for dysuria, frequency and urgency.  Musculoskeletal:  Negative for back pain, myalgias and neck pain.  Skin:  Negative for itching and rash.  Endo/Heme/Allergies:  Negative for polydipsia.  Psychiatric/Behavioral:  Negative for depression. The patient is not nervous/anxious.     Objective:   BP 104/60 (BP Location: Left Arm, Patient  Position: Sitting, Cuff Size: Normal)   Pulse 77   Temp 98 F (36.7 C) (Temporal)   Ht 5\' 3"  (1.6 m)   Wt 118 lb (53.5 kg)   LMP 08/21/2022 (Approximate)   SpO2 100%   BMI 20.90 kg/m  Body mass index is 20.9 kg/m.   General Appearance:    Alert, cooperative, no distress, appears stated age  Head:    Normocephalic, without obvious abnormality, atraumatic  Eyes:    PERRL, conjunctiva/corneas clear, EOM's intact, fundi    benign, both eyes R upper eyelid with single pustule  Ears:    Normal TM's and external ear canals, both ears  Nose:   Nares normal, septum midline, mucosa normal, no drainage    or sinus tenderness  Throat:   Lips, mucosa, and tongue normal; teeth and gums normal  Neck:   Supple, symmetrical, trachea midline, no adenopathy;    thyroid:  no enlargement/tenderness/nodules; no carotid   bruit or JVD  Back:     Symmetric, no curvature, ROM normal, no CVA tenderness  Lungs:     Clear to auscultation bilaterally, respirations unlabored  Chest Wall:    No tenderness or deformity   Heart:    Regular rate and rhythm, S1 and S2 normal, no murmur, rub or gallop  Breast Exam:    Deferred  Abdomen:     Soft, non-tender, bowel sounds active all four quadrants,    no masses, no organomegaly  Genitalia:    Deferred  Extremities:   Extremities normal, atraumatic, no cyanosis or edema  Pulses:   2+ and symmetric all extremities  Skin:   Skin color, texture, turgor normal, no rashes or lesions  Lymph nodes:   Cervical, supraclavicular, and axillary nodes normal  Neurologic:   CNII-XII intact, normal strength, sensation and reflexes    throughout    Assessment/Plan:   Encounter for general adult medical examination with abnormal findings Today patient counseled on age appropriate routine health concerns for screening and prevention, each reviewed and up to date or declined. Immunizations reviewed and up to date or declined. Labs ordered and reviewed. Risk factors for  depression reviewed and negative. Hearing function and visual acuity are intact. ADLs screened and addressed as needed. Functional ability and level of safety reviewed and appropriate. Education, counseling and referrals performed based on assessed risks today. Patient provided with a copy of personalized plan for preventive services.  Attention deficit hyperactivity disorder (ADHD), combined type Well controlled Continue Adderall XR 20 mg daily Follow-up in 6 months, sooner if concerns  Eyelid lesion Referral to derm  Screening for lipid disorders Update lipid panel  I,Param Shah,acting as a scribe for [employer],  PA.,have documented all relevant documentation on the behalf of Jarold Motto, PA,as directed by  Jarold Motto, PA while in the presence of Jarold Motto, [place].  I, Jarold Motto, [place], have reviewed all documentation for this visit. The documentation on 09/22/22 for the exam, diagnosis, procedures, and orders are all accurate and complete.   Jarold Motto, PA-C [facility]

## 2022-09-22 NOTE — Patient Instructions (Signed)
It was great to see you! ? ?Please go to the lab for blood work.  ? ?Our office will call you with your results unless you have chosen to receive results via MyChart. ? ?If your blood work is normal we will follow-up each year for physicals and as scheduled for chronic medical problems. ? ?If anything is abnormal we will treat accordingly and get you in for a follow-up. ? ?Take care, ? ?Sharifah Champine ?  ? ? ?

## 2022-10-06 ENCOUNTER — Encounter (HOSPITAL_BASED_OUTPATIENT_CLINIC_OR_DEPARTMENT_OTHER): Payer: Self-pay

## 2022-10-06 ENCOUNTER — Ambulatory Visit (HOSPITAL_BASED_OUTPATIENT_CLINIC_OR_DEPARTMENT_OTHER)
Admission: RE | Admit: 2022-10-06 | Discharge: 2022-10-06 | Disposition: A | Discharge status: Home / Self Care | Payer: 59 | Source: Ambulatory Visit | Attending: Physician Assistant | Admitting: Physician Assistant

## 2022-10-06 DIAGNOSIS — Z1231 Encounter for screening mammogram for malignant neoplasm of breast: Secondary | ICD-10-CM | POA: Diagnosis present

## 2022-10-26 ENCOUNTER — Other Ambulatory Visit: Payer: Self-pay | Admitting: Physician Assistant

## 2022-10-26 MED ORDER — AMPHETAMINE-DEXTROAMPHET ER 20 MG PO CP24: 20.0000 mg | ORAL_CAPSULE | ORAL | 0 refills | Status: DC

## 2023-01-25 ENCOUNTER — Other Ambulatory Visit: Payer: Self-pay | Admitting: Physician Assistant

## 2023-01-25 MED ORDER — AMPHETAMINE-DEXTROAMPHET ER 20 MG PO CP24: 20.0000 mg | ORAL_CAPSULE | ORAL | 0 refills | Status: DC

## 2023-01-31 IMAGING — MG MM DIGITAL SCREENING BILAT W/ TOMO AND CAD
8 series · 9 of 24 positions shown · non-contrast
Comparison: Previous exam(s).

CLINICAL DATA: Screening.

EXAM:
DIGITAL SCREENING BILATERAL MAMMOGRAM WITH TOMOSYNTHESIS AND CAD
TECHNIQUE: Bilateral screening digital craniocaudal and mediolateral oblique
mammograms were obtained. Bilateral screening digital breast
tomosynthesis was performed. The images were evaluated with
computer-aided detection.

[R CC synth-2D]
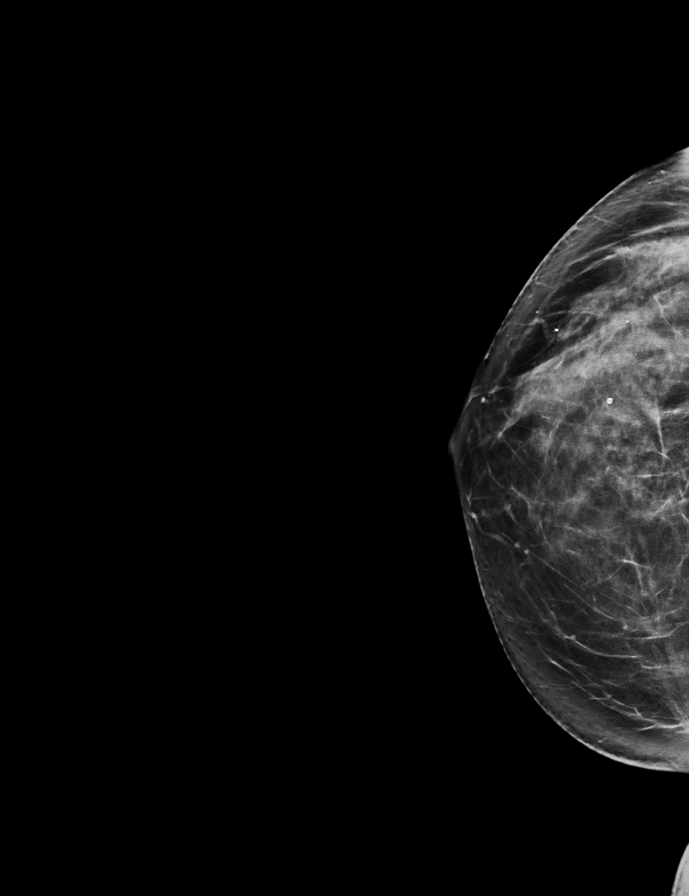

[L MLO synth-2D]
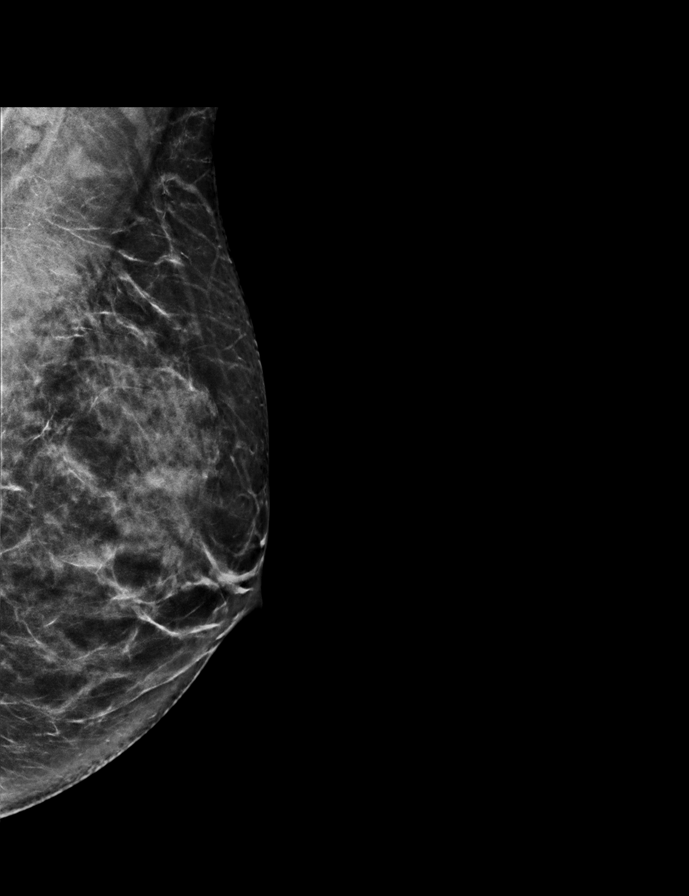

[L CC synth-2D]
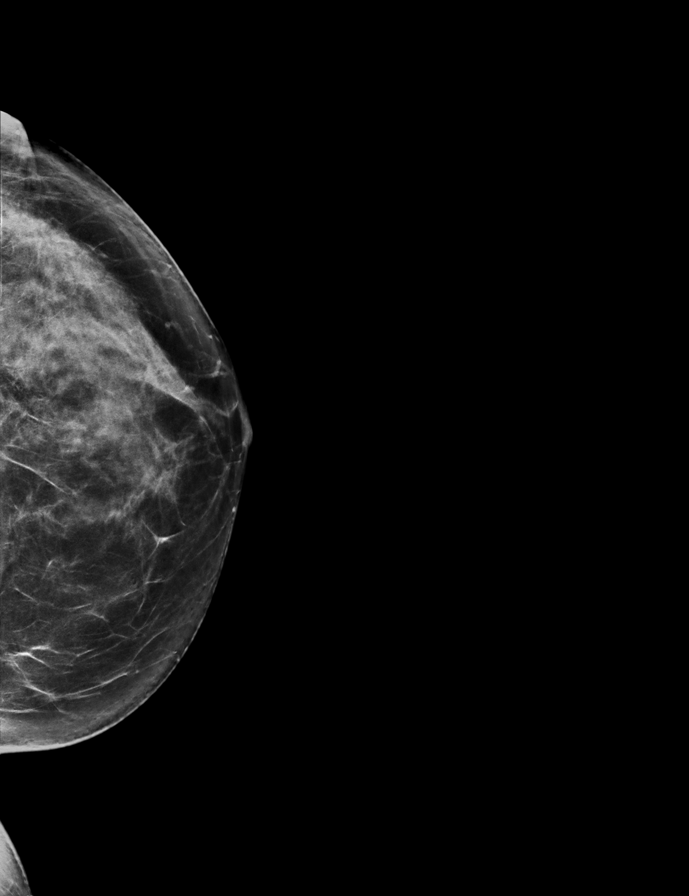

[R MLO synth-2D]
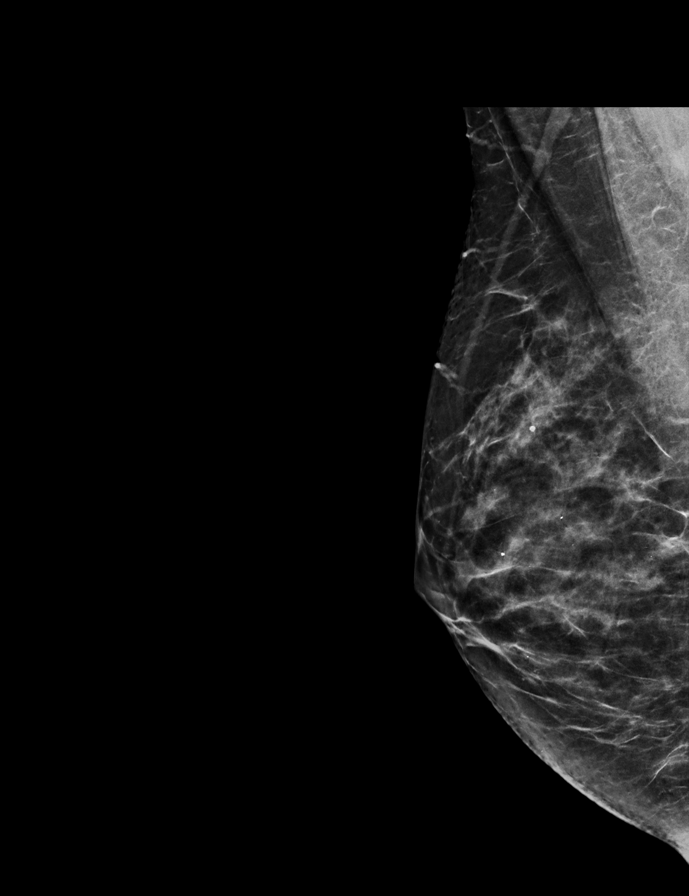

[R MLO tomo · 2 of 63 frames shown]
[frame 21/63]
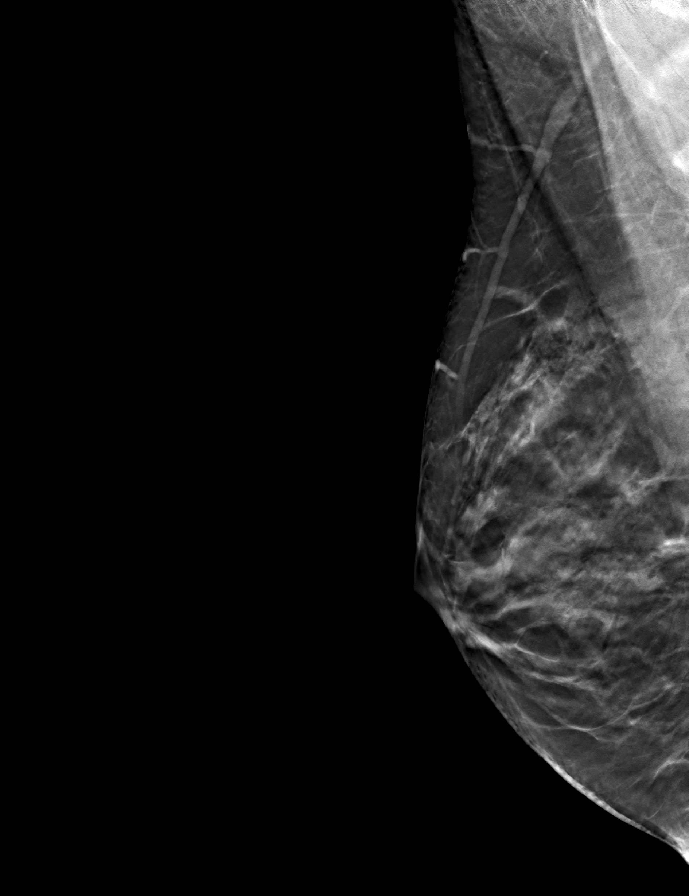
[frame 32/63]
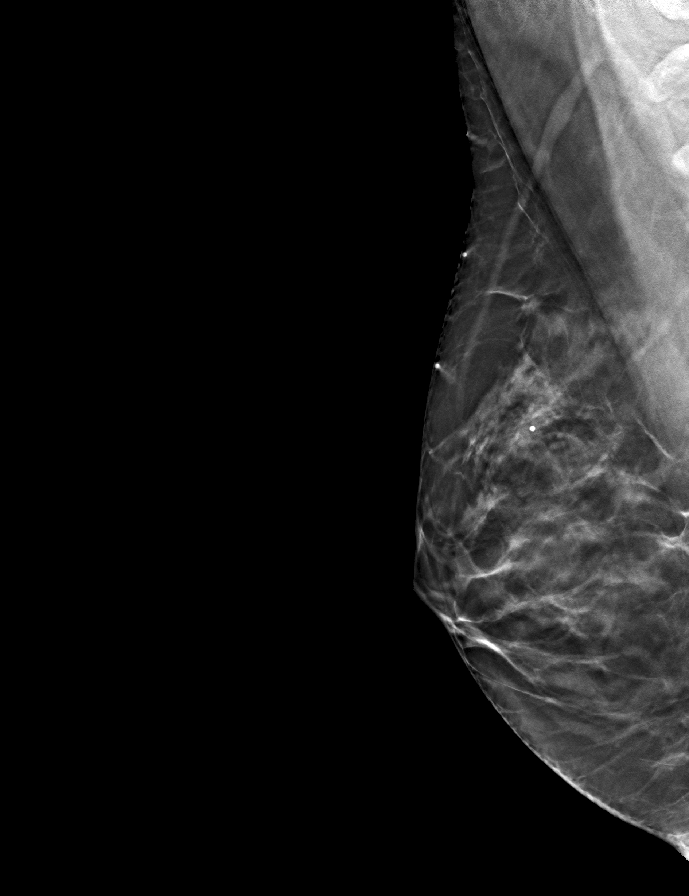

[R CC tomo · tomo slice 35/69.0]
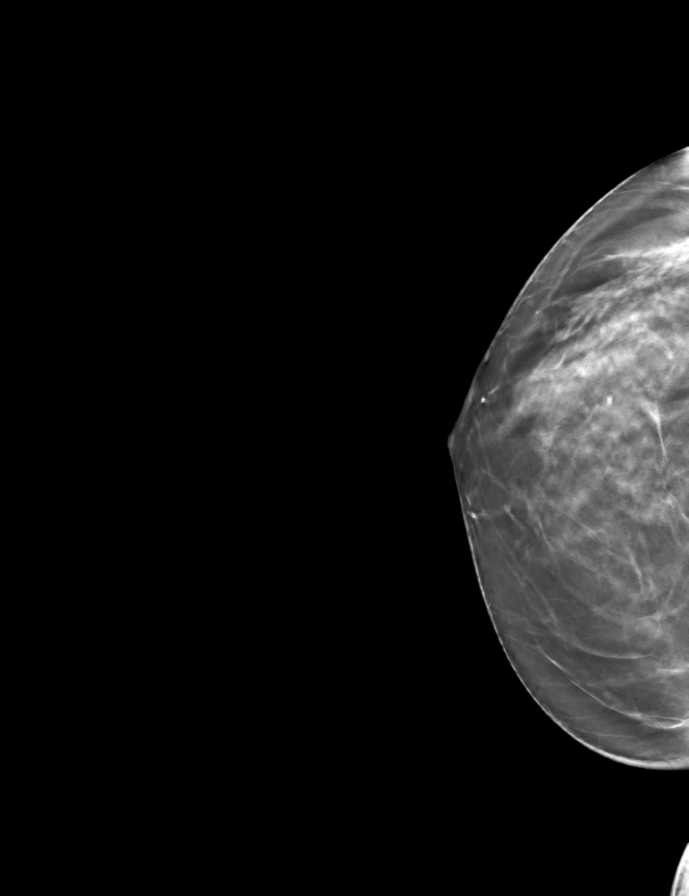

[L CC tomo · tomo slice 35/69.0]
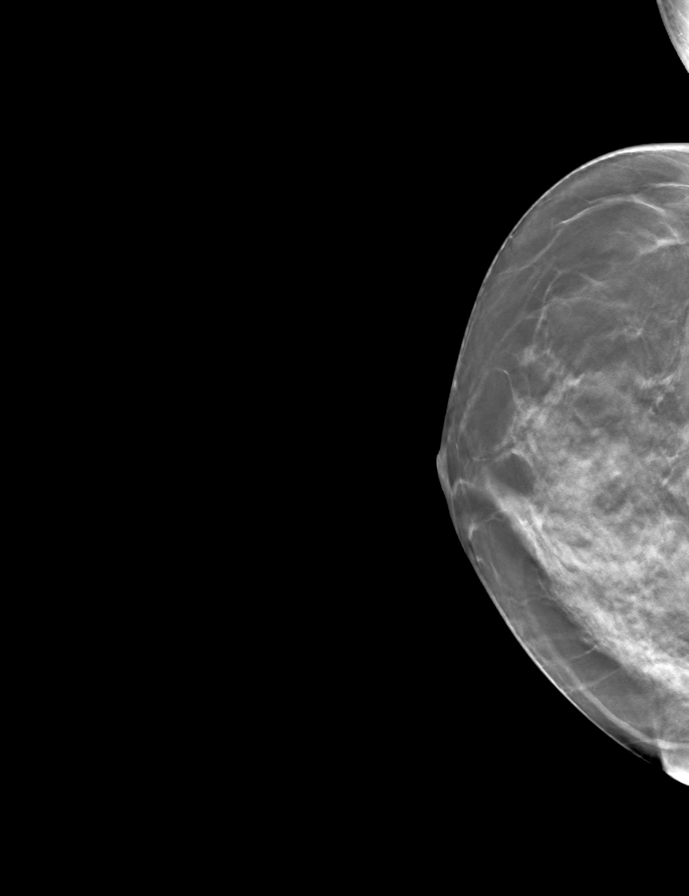

[L MLO tomo · tomo slice 31/62.0]
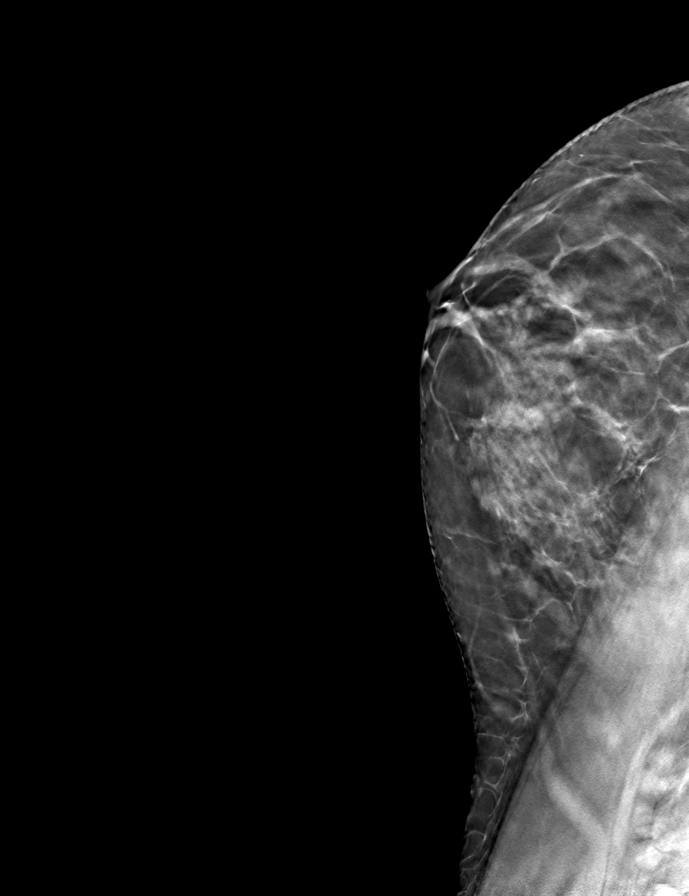

[9 of 24 positions shown; findings below may reference images not displayed]

ACR Breast Density Category c: The breast tissue is heterogeneously
dense, which may obscure small masses.
FINDINGS: There are no findings suspicious for malignancy.
IMPRESSION: No mammographic evidence of malignancy. A result letter of this
screening mammogram will be mailed directly to the patient.

RECOMMENDATION:
Screening mammogram in one year. (Code:Q3-W-BC3)

BI-RADS CATEGORY  1: Negative.

## 2023-03-10 NOTE — Progress Notes (Deleted)
47 y.o. [id number] Married White or Caucasian Not Hispanic or Latino female here for annual exam.      No LMP recorded. (Menstrual status: Irregular Periods).          Sexually active: {yes no:314532}  The current method of family planning is {contraception:315051}.    Exercising: {yes no:314532}  {types:19826} Smoker:  {YES NO:22349}  Health Maintenance: Pap::  06/15/18 normal, negative hpv (at [facility])   History of abnormal Pap:  yes follow up was normal  MMG:  10/08/22 Density C Bi-rads 1 neg  BMD:   n/a Colonoscopy: n/a TDaP:  06/24/16 Gardasil: none   reports that she has never smoked. She has never used smokeless tobacco. She reports current alcohol use. She reports that she does not use drugs.  Past Medical History:  Diagnosis Date   Amenorrhea     Past Surgical History:  Procedure Laterality Date   DILATION AND CURETTAGE OF UTERUS  2008    Current Outpatient Medications  Medication Sig Dispense Refill   amphetamine-dextroamphetamine (ADDERALL XR) 20 MG 24 hr capsule Take 1 capsule (20 mg total) by mouth every morning. 90 capsule 0   cholecalciferol (VITAMIN D3) 25 MCG (1000 UT) tablet Take 1,000 Units by mouth daily.     fluticasone (FLONASE) 50 MCG/ACT nasal spray Place into both nostrils daily.     norethindrone-ethinyl estradiol-FE (LOESTRIN FE) 1-20 MG-MCG tablet Take 1 tablet by mouth daily. 84 tablet 4   No current facility-administered medications for this visit.    Family History  Problem Relation Age of Onset   Hypertension Mother    Hyperlipidemia Mother    Hypertension Father    ADD / ADHD Father    Berenice Primas' disease Father    Diabetes Sister        she is twin    Breast cancer Maternal Grandmother 16   Colon cancer Paternal Grandfather 75       possible    Review of Systems  Exam:   There were no vitals taken for this visit.  Weight change: @WEIGHTCHANGE @ Height:      Ht Readings from Last 3 Encounters:  09/22/22 5\' 3"  (1.6 m)  07/22/22 5\' 3"  (1.6  m)  03/16/22 5\' 3"  (1.6 m)    General appearance: alert, cooperative and appears stated age Head: Normocephalic, without obvious abnormality, atraumatic Neck: no adenopathy, supple, symmetrical, trachea midline and thyroid {CHL AMB PHY EX THYROID NORM DEFAULT:[phone]::"normal to inspection and palpation"} Lungs: clear to auscultation bilaterally Cardiovascular: regular rate and rhythm Breasts: {Exam; breast:13139::"normal appearance, no masses or tenderness"} Abdomen: soft, non-tender; non distended,  no masses,  no organomegaly Extremities: extremities normal, atraumatic, no cyanosis or edema Skin: Skin color, texture, turgor normal. No rashes or lesions Lymph nodes: Cervical, supraclavicular, and axillary nodes normal. No abnormal inguinal nodes palpated Neurologic: Grossly normal   Pelvic: External genitalia:  no lesions              Urethra:  normal appearing urethra with no masses, tenderness or lesions              Bartholins and Skenes: normal                 Vagina: normal appearing vagina with normal color and discharge, no lesions              Cervix: {CHL AMB PHY EX CERVIX NORM DEFAULT:[phone]::"no lesions"}               Bimanual Exam:  Uterus:  {CHL AMB PHY EX UTERUS NORM [phone]::"normal size, contour, position, consistency, mobility, non-tender"}              Adnexa: {CHL AMB PHY EX ADNEXA NO MASS DEFAULT:[phone]::"no mass, fullness, tenderness"}               Rectovaginal: Confirms               Anus:  normal sphincter tone, no lesions  *** chaperoned for the exam.  A:  Well Woman with normal exam  P:

## 2023-03-17 NOTE — Progress Notes (Signed)
Angelica Lee is a 47 y.o. female here for a follow up of a pre-existing problem.  History of Present Illness:   Chief Complaint  Patient presents with   ADHD    HPI  ADHD Patient is currently taking Adderall XR 20 mg with no adverse side effects.  Tolerating this well with no complications. Denies worsening anxiety, insomnia.  Past Medical History:  Diagnosis Date   Amenorrhea      Social History   Tobacco Use   Smoking status: Never   Smokeless tobacco: Never  Vaping Use   Vaping Use: Never used  Substance Use Topics   Alcohol use: Yes    Comment: 4-5 days a week glass of wine a night    Drug use: Never    Past Surgical History:  Procedure Laterality Date   DILATION AND CURETTAGE OF UTERUS  2008    Family History  Problem Relation Age of Onset   Hypertension Mother    Hyperlipidemia Mother    Hypertension Father    ADD / ADHD Father    Graves' disease Father    Diabetes Sister        she is twin    Breast cancer Maternal Grandmother 31   Colon cancer Paternal Grandfather 71       possible    No Known Allergies  Current Medications:   Current Outpatient Medications:    amphetamine-dextroamphetamine (ADDERALL XR) 20 MG 24 hr capsule, Take 1 capsule (20 mg total) by mouth every morning., Disp: 90 capsule, Rfl: 0   cholecalciferol (VITAMIN D3) 25 MCG (1000 UT) tablet, Take 1,000 Units by mouth daily., Disp: , Rfl:    fluticasone (FLONASE) 50 MCG/ACT nasal spray, Place into both nostrils daily., Disp: , Rfl:    norethindrone-ethinyl estradiol-FE (LOESTRIN FE) 1-20 MG-MCG tablet, Take 1 tablet by mouth daily., Disp: 84 tablet, Rfl: 4   Review of Systems:   ROS Negative unless otherwise specified per HPI.  Vitals:   Vitals:   03/24/23 0835  BP: 104/70  Pulse: 78  Temp: 97.8 F (36.6 C)  TempSrc: Temporal  SpO2: 99%  Weight: 121 lb 8 oz (55.1 kg)  Height: 5\' 3"  (1.6 m)     Body mass index is 21.52 kg/m.  Physical Exam:   Physical  Exam Vitals and nursing note reviewed.  Constitutional:      General: She is not in acute distress.    Appearance: She is well-developed. She is not ill-appearing or toxic-appearing.  Cardiovascular:     Rate and Rhythm: Normal rate and regular rhythm.     Pulses: Normal pulses.     Heart sounds: Normal heart sounds, S1 normal and S2 normal.  Pulmonary:     Effort: Pulmonary effort is normal.     Breath sounds: Normal breath sounds.  Skin:    General: Skin is warm and dry.  Neurological:     Mental Status: She is alert.     GCS: GCS eye subscore is 4. GCS verbal subscore is 5. GCS motor subscore is 6.  Psychiatric:        Speech: Speech normal.        Behavior: Behavior normal. Behavior is cooperative.     Assessment and Plan:   Attention deficit hyperactivity disorder (ADHD), combined type Well controlled PDMP reviewed - no red flags Continue Adderall XR 20 mg daily Follow-up in 6 months, sooner if concerns.   I,Alexander Ruley,acting as a Neurosurgeon for [employer], PA.,have documented all relevant documentation  on the behalf of Jarold Motto, PA,as directed by  Jarold Motto, PA while in the presence of Jarold Motto, [place].   I, Jarold Motto, [place], have reviewed all documentation for this visit. The documentation on 03/24/23 for the exam, diagnosis, procedures, and orders are all accurate and complete.   Jarold Motto, PA-C

## 2023-03-18 ENCOUNTER — Ambulatory Visit: Payer: 59 | Admitting: Obstetrics and Gynecology

## 2023-03-24 ENCOUNTER — Ambulatory Visit (INDEPENDENT_AMBULATORY_CARE_PROVIDER_SITE_OTHER): Payer: 59 | Admitting: Physician Assistant

## 2023-03-24 ENCOUNTER — Encounter: Payer: Self-pay | Admitting: Physician Assistant

## 2023-03-24 VITALS — BP 104/70 | HR 78 | Temp 97.8°F | Ht 63.0 in | Wt 121.5 lb

## 2023-03-24 DIAGNOSIS — F902 Attention-deficit hyperactivity disorder, combined type: Secondary | ICD-10-CM | POA: Diagnosis not present

## 2023-03-24 MED ORDER — AMPHETAMINE-DEXTROAMPHET ER 20 MG PO CP24: 20.0000 mg | ORAL_CAPSULE | ORAL | 0 refills | Status: DC

## 2023-03-30 ENCOUNTER — Other Ambulatory Visit: Payer: Self-pay | Admitting: Obstetrics and Gynecology

## 2023-03-30 DIAGNOSIS — Z3041 Encounter for surveillance of contraceptive pills: Secondary | ICD-10-CM

## 2023-03-30 NOTE — Progress Notes (Signed)
47 y.o. [id number] Married White or Caucasian Not Hispanic or Latino female here for annual exam.  She is still having irregular periods on OCP's. Gets a very light cycle ~qo month. No dyspareunia.  Period Pattern: (!) Irregular Menstrual Flow: Light Menstrual Control: Thin pad Menstrual Control Change Freq (Hours): 4-6 Dysmenorrhea: None  Patient's last menstrual period was 03/31/2023 (approximate).          Sexually active: Yes.    The current method of family planning is OCP (estrogen/progesterone).    Exercising: Yes.     Walking running  Smoker:  no  Health Maintenance: Pap:06/15/18 normal, negative hpv (at [facility])   History of abnormal Pap:  yes follow up was normal  MMG:  10/06/22 Density C Bi-rads 1 neg  BMD:   na Colonoscopy: 2011 was normal  TDaP:  06/24/16  Gardasil: none    reports that she has never smoked. She has never used smokeless tobacco. She reports current alcohol use. She reports that she does not use drugs. 3-4 drinks a weeks. 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 Kids are 4 (girl) and 59 (boy). Has a Advertising account planner.    Past Medical History:  Diagnosis Date   Amenorrhea     Past Surgical History:  Procedure Laterality Date   DILATION AND CURETTAGE OF UTERUS  2008    Current Outpatient Medications  Medication Sig Dispense Refill   amphetamine-dextroamphetamine (ADDERALL XR) 20 MG 24 hr capsule Take 1 capsule (20 mg total) by mouth every morning. 90 capsule 0   cholecalciferol (VITAMIN D3) 25 MCG (1000 UT) tablet Take 1,000 Units by mouth daily.     fluticasone (FLONASE) 50 MCG/ACT nasal spray Place into both nostrils daily.     norethindrone-ethinyl estradiol-FE (HAILEY FE 1/20) 1-20 MG-MCG tablet TAKE ONE TABLET BY MOUTH DAILY 28 tablet 0   No current facility-administered medications for this visit.    Family History  Problem Relation Age of Onset   Hypertension Mother    Hyperlipidemia Mother    Hypertension Father    ADD  / ADHD Father    Luiz Blare' disease Father    Diabetes Sister        she is twin    Breast cancer Maternal Grandmother 85   Colon cancer Paternal Grandfather 73       possible    Review of Systems  All other systems reviewed and are negative.   Exam:   BP 124/72   Pulse 66   Ht  (1.6 m)   Wt 122 lb 6.4 oz (55.5 kg)   LMP 03/31/2023 (Approximate)   SpO2 100%   BMI 21.68 kg/m   Weight change: @ Height:   Height:  (160 cm)  Ht Readings from Last 3 Encounters:  04/06/23  (1.6 m)  03/24/23  (1.6 m)  09/22/22  (1.6 m)    General appearance: alert, cooperative and appears stated age Head: Normocephalic, without obvious abnormality, atraumatic Neck: no adenopathy, supple, symmetrical, trachea midline and thyroid normal to inspection and palpation Lungs: clear to auscultation bilaterally Cardiovascular: regular rate and rhythm Breasts: normal appearance, no masses or tenderness Abdomen: soft, non-tender; non distended,  no masses,  no organomegaly Extremities: extremities normal, atraumatic, no cyanosis or edema Skin: Skin color, texture, turgor normal. No rashes or lesions Lymph nodes: Cervical, supraclavicular, and axillary nodes normal. No abnormal inguinal nodes palpated Neurologic: Grossly normal   Pelvic: External genitalia:  no lesions  Urethra:  normal appearing urethra with no masses, tenderness or lesions              Bartholins and Skenes: normal                 Vagina: normal appearing vagina with normal color and discharge, no lesions              Cervix: no lesions               Bimanual Exam:  Uterus:  normal size, contour, position, consistency, mobility, non-tender and anteverted              Adnexa: no mass, fullness, tenderness               Rectovaginal: Confirms               Anus:  normal sphincter tone, no lesions  Carolynn Serve, CMA chaperoned for the exam.  1. Well woman exam Discussed breast self  exam Discussed calcium and vit D intake Mammogram UTD Labs with primary Colon cancer screening next year  2. Encounter for surveillance of contraceptive pills Doing well - norethindrone-ethinyl estradiol-FE (HAILEY FE 1/20) 1-20 MG-MCG tablet; Take 1 tablet by mouth daily.  Dispense: 84 tablet; Refill: 3  3. Screening for cervical cancer - Cytology - PAP

## 2023-03-30 NOTE — Telephone Encounter (Signed)
Med refill request: Angelica Lee Last AEX: 03/16/22 Next AEX: 04/06/23 Last MMG (if hormonal med) 10/06/22 Refill authorized: Please Advise?

## 2023-04-06 ENCOUNTER — Ambulatory Visit (INDEPENDENT_AMBULATORY_CARE_PROVIDER_SITE_OTHER): Payer: 59 | Admitting: Obstetrics and Gynecology

## 2023-04-06 ENCOUNTER — Encounter: Payer: Self-pay | Admitting: Obstetrics and Gynecology

## 2023-04-06 ENCOUNTER — Other Ambulatory Visit (HOSPITAL_COMMUNITY)
Admission: RE | Admit: 2023-04-06 | Discharge: 2023-04-06 | Disposition: A | Discharge status: Home / Self Care | Payer: 59 | Source: Ambulatory Visit | Attending: Obstetrics and Gynecology | Admitting: Obstetrics and Gynecology

## 2023-04-06 VITALS — BP 124/72 | HR 66 | Ht 63.0 in | Wt 122.4 lb

## 2023-04-06 DIAGNOSIS — Z3041 Encounter for surveillance of contraceptive pills: Secondary | ICD-10-CM | POA: Diagnosis not present

## 2023-04-06 DIAGNOSIS — Z01419 Encounter for gynecological examination (general) (routine) without abnormal findings: Secondary | ICD-10-CM | POA: Diagnosis not present

## 2023-04-06 DIAGNOSIS — Z124 Encounter for screening for malignant neoplasm of cervix: Secondary | ICD-10-CM | POA: Diagnosis not present

## 2023-04-06 MED ORDER — NORETHIN ACE-ETH ESTRAD-FE 1-20 MG-MCG PO TABS: 1.0000 | ORAL_TABLET | Freq: Every day | ORAL | 3 refills | Status: DC

## 2023-04-06 NOTE — Patient Instructions (Signed)

## 2023-04-09 LAB — CYTOLOGY - PAP
Comment: NEGATIVE
Diagnosis: NEGATIVE
Diagnosis: REACTIVE
High risk HPV: NEGATIVE

## 2023-04-23 ENCOUNTER — Other Ambulatory Visit: Payer: Self-pay | Admitting: Physician Assistant

## 2023-04-23 MED ORDER — AMPHETAMINE-DEXTROAMPHET ER 20 MG PO CP24: 20.0000 mg | ORAL_CAPSULE | ORAL | 0 refills | Status: DC

## 2023-04-23 NOTE — Telephone Encounter (Signed)
Last refill: 03/24/23 #90, 0 Last OV: 03/24/23 dx. ADHD

## 2023-07-27 ENCOUNTER — Other Ambulatory Visit: Payer: Self-pay | Admitting: Physician Assistant

## 2023-07-27 MED ORDER — AMPHETAMINE-DEXTROAMPHET ER 20 MG PO CP24: 20.0000 mg | ORAL_CAPSULE | ORAL | 0 refills | Status: DC

## 2023-07-27 NOTE — Telephone Encounter (Signed)
Pt requesting refill for Adderall XR 20 mg. Last OV 03/2023.

## 2023-08-26 ENCOUNTER — Other Ambulatory Visit (HOSPITAL_BASED_OUTPATIENT_CLINIC_OR_DEPARTMENT_OTHER): Payer: Self-pay | Admitting: Physician Assistant

## 2023-08-26 DIAGNOSIS — Z1231 Encounter for screening mammogram for malignant neoplasm of breast: Secondary | ICD-10-CM

## 2023-09-22 NOTE — Progress Notes (Signed)
Subjective:    Angelica Lee is a 47 y.o. female and is here for a comprehensive physical exam.  HPI  Health Maintenance Due  Topic Date Due   INFLUENZA VACCINE  07/15/2023    Acute Concerns: ***  Chronic Issues: ADHD Treated with Adderall 20 mg daily.  Health Maintenance: Immunizations -- UTD on tetanus vaccine. Colonoscopy -- Due for first screening. Mammogram -- Normal on 10/08/22. PAP -- Normal on 04/06/23.  Bone Density -- N/A Diet -- *** Exercise -- ***  Sleep habits -- *** Mood -- ***  UTD with dentist? - *** UTD with eye doctor? - ***  Weight history: Wt Readings from Last 10 Encounters:  04/06/23 122 lb 6.4 oz (55.5 kg)  03/24/23 121 lb 8 oz (55.1 kg)  09/22/22 118 lb (53.5 kg)  07/22/22 118 lb 4 oz (53.6 kg)  03/16/22 118 lb (53.5 kg)  09/16/21 119 lb (54 kg)  03/13/21 119 lb (54 kg)  02/11/21 119 lb 6.1 oz (54.2 kg)  08/14/20 118 lb 6.1 oz (53.7 kg)  03/05/20 117 lb (53.1 kg)   There is no height or weight on file to calculate BMI. No LMP recorded. (Menstrual status: Irregular Periods).  Alcohol use:  reports current alcohol use.  Tobacco use:  Tobacco Use: Low Risk  (04/06/2023)   Patient History    Smoking Tobacco Use: Never    Smokeless Tobacco Use: Never    Passive Exposure: Not on file   Eligible for lung cancer screening? ***     09/22/2022   10:03 AM  Depression screen PHQ 2/9  Decreased Interest 0  Down, Depressed, Hopeless 0  PHQ - 2 Score 0     Other providers/specialists: Patient Care Team: Jarold Motto, [place] as PCP - General (Physician Assistant)    PMHx, SurgHx, SocialHx, Medications, and Allergies were reviewed in the Visit Navigator and updated as appropriate.   Past Medical History:  Diagnosis Date   Amenorrhea      Past Surgical History:  Procedure Laterality Date   DILATION AND CURETTAGE OF UTERUS  2008     Family History  Problem Relation Age of Onset   Hypertension Mother    Hyperlipidemia  Mother    Hypertension Father    ADD / ADHD Father    Graves' disease Father    Diabetes Sister        she is twin    Breast cancer Maternal Grandmother 85   Colon cancer Paternal Grandfather 23       possible    Social History   Tobacco Use   Smoking status: Never   Smokeless tobacco: Never  Vaping Use   Vaping status: Never Used  Substance Use Topics   Alcohol use: Yes    Comment: 4-5 days a week glass of wine a night    Drug use: Never    Review of Systems:   ROS  Objective:   There were no vitals taken for this visit. There is no height or weight on file to calculate BMI.   General Appearance:    Alert, cooperative, no distress, appears stated age  Head:    Normocephalic, without obvious abnormality, atraumatic  Eyes:    PERRL, conjunctiva/corneas clear, EOM's intact, fundi    benign, both eyes  Ears:    Normal TM's and external ear canals, both ears  Nose:   Nares normal, septum midline, mucosa normal, no drainage    or sinus tenderness  Throat:   Lips, mucosa,  and tongue normal; teeth and gums normal  Neck:   Supple, symmetrical, trachea midline, no adenopathy;    thyroid:  no enlargement/tenderness/nodules; no carotid   bruit or JVD  Back:     Symmetric, no curvature, ROM normal, no CVA tenderness  Lungs:     Clear to auscultation bilaterally, respirations unlabored  Chest Wall:    No tenderness or deformity   Heart:    Regular rate and rhythm, S1 and S2 normal, no murmur, rub or gallop  Breast Exam:    ***No tenderness, masses, or nipple abnormality  Abdomen:     Soft, non-tender, bowel sounds active all four quadrants,    no masses, no organomegaly  Genitalia:    ***Normal female without lesion, discharge or tenderness  Extremities:   Extremities normal, atraumatic, no cyanosis or edema  Pulses:   2+ and symmetric all extremities  Skin:   Skin color, texture, turgor normal, no rashes or lesions  Lymph nodes:   Cervical, supraclavicular, and axillary  nodes normal  Neurologic:   CNII-XII intact, normal strength, sensation and reflexes    throughout    Assessment/Plan:   ***   I,Alexander Ruley,acting as a scribe for [employer], PA.,have documented all relevant documentation on the behalf of Jarold Motto, PA,as directed by  Jarold Motto, PA while in the presence of Jarold Motto, [place].   ***   Jarold Motto, PA-C [place] [facility]

## 2023-09-29 ENCOUNTER — Ambulatory Visit: Payer: 59 | Admitting: Physician Assistant

## 2023-09-29 ENCOUNTER — Encounter: Payer: Self-pay | Admitting: Physician Assistant

## 2023-09-29 VITALS — BP 110/70 | HR 72 | Temp 97.5°F | Ht 63.0 in | Wt 120.2 lb

## 2023-09-29 DIAGNOSIS — F902 Attention-deficit hyperactivity disorder, combined type: Secondary | ICD-10-CM

## 2023-09-29 DIAGNOSIS — Z13220 Encounter for screening for lipoid disorders: Secondary | ICD-10-CM | POA: Diagnosis not present

## 2023-09-29 DIAGNOSIS — Z136 Encounter for screening for cardiovascular disorders: Secondary | ICD-10-CM | POA: Diagnosis not present

## 2023-09-29 DIAGNOSIS — Z0000 Encounter for general adult medical examination without abnormal findings: Secondary | ICD-10-CM

## 2023-09-29 DIAGNOSIS — E559 Vitamin D deficiency, unspecified: Secondary | ICD-10-CM | POA: Diagnosis not present

## 2023-09-29 DIAGNOSIS — Z1211 Encounter for screening for malignant neoplasm of colon: Secondary | ICD-10-CM

## 2023-09-29 LAB — CBC WITH DIFFERENTIAL/PLATELET
Basophils Absolute: 0 10*3/uL (ref 0.0–0.1)
Basophils Relative: 0.6 % (ref 0.0–3.0)
Eosinophils Absolute: 0.2 10*3/uL (ref 0.0–0.7)
Eosinophils Relative: 4.4 % (ref 0.0–5.0)
HCT: 41.8 % (ref 36.0–46.0)
Hemoglobin: 13.6 g/dL (ref 12.0–15.0)
Lymphocytes Relative: 35.9 % (ref 12.0–46.0)
Lymphs Abs: 1.7 10*3/uL (ref 0.7–4.0)
MCHC: 32.5 g/dL (ref 30.0–36.0)
MCV: 92.2 fL (ref 78.0–100.0)
Monocytes Absolute: 0.4 10*3/uL (ref 0.1–1.0)
Monocytes Relative: 7.8 % (ref 3.0–12.0)
Neutro Abs: 2.5 10*3/uL (ref 1.4–7.7)
Neutrophils Relative %: 51.3 % (ref 43.0–77.0)
Platelets: 197 10*3/uL (ref 150.0–400.0)
RBC: 4.54 Mil/uL (ref 3.87–5.11)
RDW: 12.9 % (ref 11.5–15.5)
WBC: 4.8 10*3/uL (ref 4.0–10.5)

## 2023-09-29 LAB — COMPREHENSIVE METABOLIC PANEL
ALT: 20 U/L (ref 0–35)
AST: 19 U/L (ref 0–37)
Albumin: 4.1 g/dL (ref 3.5–5.2)
Alkaline Phosphatase: 37 U/L — ABNORMAL LOW (ref 39–117)
BUN: 14 mg/dL (ref 6–23)
CO2: 26 meq/L (ref 19–32)
Calcium: 9 mg/dL (ref 8.4–10.5)
Chloride: 104 meq/L (ref 96–112)
Creatinine, Ser: 0.72 mg/dL (ref 0.40–1.20)
GFR: 101.29 mL/min (ref >60.00)
Glucose, Bld: 90 mg/dL (ref 70–99)
Potassium: 3.9 meq/L (ref 3.5–5.1)
Sodium: 137 meq/L (ref 135–145)
Total Bilirubin: 0.4 mg/dL (ref 0.2–1.2)
Total Protein: 6.9 g/dL (ref 6.0–8.3)

## 2023-09-29 LAB — VITAMIN D 25 HYDROXY (VIT D DEFICIENCY, FRACTURES): VITD: 26.01 ng/mL — ABNORMAL LOW (ref 30.00–100.00)

## 2023-09-29 LAB — LIPID PANEL
Cholesterol: 163 mg/dL (ref 0–200)
HDL: 70.1 mg/dL (ref >39.00)
LDL Cholesterol: 77 mg/dL (ref 0–99)
NonHDL: 92.49
Total CHOL/HDL Ratio: 2
Triglycerides: 75 mg/dL (ref 0.0–149.0)
VLDL: 15 mg/dL (ref 0.0–40.0)

## 2023-09-29 MED ORDER — AMPHETAMINE-DEXTROAMPHET ER 20 MG PO CP24
20.0000 mg | ORAL_CAPSULE | ORAL | 0 refills | Status: DC
Start: 2023-11-28 — End: 2023-12-20

## 2023-09-29 MED ORDER — AMPHETAMINE-DEXTROAMPHET ER 20 MG PO CP24: 20.0000 mg | ORAL_CAPSULE | ORAL | 0 refills | Status: DC

## 2023-09-29 NOTE — Patient Instructions (Signed)
It was great to see you! ? ?Please go to the lab for blood work.  ? ?Our office will call you with your results unless you have chosen to receive results via MyChart. ? ?If your blood work is normal we will follow-up each year for physicals and as scheduled for chronic medical problems. ? ?If anything is abnormal we will treat accordingly and get you in for a follow-up. ? ?Take care, ? ?Jerrin Recore ?  ? ? ?

## 2023-10-11 ENCOUNTER — Ambulatory Visit (HOSPITAL_BASED_OUTPATIENT_CLINIC_OR_DEPARTMENT_OTHER)
Admission: RE | Admit: 2023-10-11 | Discharge: 2023-10-11 | Disposition: A | Discharge status: Home / Self Care | Payer: 59 | Source: Ambulatory Visit | Attending: Physician Assistant | Admitting: Physician Assistant

## 2023-10-11 ENCOUNTER — Encounter (HOSPITAL_BASED_OUTPATIENT_CLINIC_OR_DEPARTMENT_OTHER): Payer: Self-pay

## 2023-10-11 DIAGNOSIS — Z1231 Encounter for screening mammogram for malignant neoplasm of breast: Secondary | ICD-10-CM | POA: Diagnosis present

## 2023-12-01 ENCOUNTER — Other Ambulatory Visit: Payer: Self-pay | Admitting: Physician Assistant

## 2023-12-01 NOTE — Telephone Encounter (Signed)
I believe this is an error -- looks like she has one fill for December yet. If not, I can send in.

## 2023-12-01 NOTE — Telephone Encounter (Signed)
Pt requesting refill for Adderall XR 20 mg. Last OV 09/29/2023.

## 2023-12-19 ENCOUNTER — Other Ambulatory Visit: Payer: Self-pay | Admitting: Physician Assistant

## 2023-12-20 ENCOUNTER — Other Ambulatory Visit: Payer: Self-pay | Admitting: Physician Assistant

## 2023-12-20 MED ORDER — AMPHETAMINE-DEXTROAMPHET ER 20 MG PO CP24: 20.0000 mg | ORAL_CAPSULE | ORAL | 0 refills | Status: DC

## 2023-12-20 MED ORDER — AMPHETAMINE-DEXTROAMPHET ER 20 MG PO CP24
20.0000 mg | ORAL_CAPSULE | ORAL | 0 refills | Status: DC
Start: 2024-02-18 — End: 2024-02-16

## 2024-01-05 ENCOUNTER — Encounter: Payer: Self-pay | Admitting: Pediatrics

## 2024-01-16 ENCOUNTER — Other Ambulatory Visit: Payer: Self-pay | Admitting: Physician Assistant

## 2024-01-17 MED ORDER — AMPHETAMINE-DEXTROAMPHET ER 20 MG PO CP24: 20.0000 mg | ORAL_CAPSULE | ORAL | 0 refills | Status: DC

## 2024-01-17 NOTE — Telephone Encounter (Signed)
10/16/2024LOV  12/20/2023 Fill Date  30/0 refills

## 2024-01-31 ENCOUNTER — Ambulatory Visit (AMBULATORY_SURGERY_CENTER): Payer: 59

## 2024-01-31 VITALS — Ht 63.0 in | Wt 120.0 lb

## 2024-01-31 DIAGNOSIS — Z1211 Encounter for screening for malignant neoplasm of colon: Secondary | ICD-10-CM

## 2024-01-31 MED ORDER — SUFLAVE 178.7 G PO SOLR: 1.0000 | Freq: Once | ORAL | 0 refills | Status: AC

## 2024-01-31 NOTE — Progress Notes (Signed)
 No egg or soy allergy known to patient  No issues known to pt with past sedation with any surgeries or procedures Patient denies ever being told they had issues or difficulty with intubation  No FH of Malignant Hyperthermia Pt is not on diet pills Pt is not on  home 02  Pt is not on blood thinners  Pt denies issues with chronic constipation  No A fib or A flutter Have any cardiac testing pending--no Ambulates independently Patient's chart reviewed by [name] CNRA prior to previsit and patient appropriate for the [facility].  Previsit completed and red dot placed by patient's name on their procedure day (on provider's schedule).

## 2024-02-15 NOTE — Progress Notes (Signed)
 [facility] History and Physical   Primary Care Physician:  Jarold Motto, [place]   Reason for Procedure:  Colon cancer screening  Plan:    Screening Colonoscopy   HPI: Angelica Lee is a 48 y.o. female undergoing screening colonoscopy for colon cancer screening.  Records indicate the patient had a normal colonoscopy in 2011.  Family history of polyps in father.  There is a possible family history of colorectal cancer in the patient's paternal grandfather in his 57s.  Patient currently denies symptoms of rectal bleeding or change in bowel habits.   Past Medical History:  Diagnosis Date   Amenorrhea     Past Surgical History:  Procedure Laterality Date   DILATION AND CURETTAGE OF UTERUS  2008    Prior to Admission medications   Medication Sig Start Date End Date Taking? Authorizing Provider  amphetamine-dextroamphetamine (ADDERALL XR) 20 MG 24 hr capsule Take 1 capsule (20 mg total) by mouth every morning. 02/18/24 03/19/24  Jarold Motto, PA  cholecalciferol (VITAMIN D3) 25 MCG (1000 UT) tablet Take 1,000 Units by mouth daily.    [provider]  norethindrone-ethinyl estradiol-FE (HAILEY FE 1/20) 1-20 MG-MCG tablet Take 1 tablet by mouth daily. 04/06/23   Romualdo Bolk, MD  zinc gluconate 50 MG tablet Take 50 mg by mouth daily.    [provider]    Current Outpatient Medications  Medication Sig Dispense Refill   amphetamine-dextroamphetamine (ADDERALL XR) 20 MG 24 hr capsule Take 1 capsule (20 mg total) by mouth every morning. 30 capsule 0   norethindrone-ethinyl estradiol-FE (HAILEY FE 1/20) 1-20 MG-MCG tablet Take 1 tablet by mouth daily. 84 tablet 3   cholecalciferol (VITAMIN D3) 25 MCG (1000 UT) tablet Take 1,000 Units by mouth daily.     zinc gluconate 50 MG tablet Take 50 mg by mouth daily.     Current Facility-Administered Medications  Medication Dose Route Frequency Provider Last Rate Last Admin   0.9 %  sodium chloride infusion  500  mL Intravenous Once Manuelito Poage, Durene Romans, MD        Allergies as of 02/18/2024   (No Known Allergies)    Family History  Problem Relation Age of Onset   Hypertension Mother    Hyperlipidemia Mother    Hypertension Father    ADD / ADHD Father    Graves' disease Father    Diabetes type I Sister        she is twin    Breast cancer Maternal Grandmother 32   Colon cancer Paternal Grandfather 76       possible   Esophageal cancer Neg Hx    Rectal cancer Neg Hx    Stomach cancer Neg Hx     Social History   Socioeconomic History   Marital status: Married    Spouse name: Not on file   Number of children: Not on file   Years of education: Not on file   Highest education level: Master's degree (e.g., MA, MS, MEng, MEd, MSW, MBA)  Occupational History   Not on file  Tobacco Use   Smoking status: Never   Smokeless tobacco: Never  Vaping Use   Vaping status: Never Used  Substance and Sexual Activity   Alcohol use: Yes    Alcohol/week: 1.0 standard drink of alcohol    Types: 1 Glasses of wine per week   Drug use: Never   Sexual activity: Yes  Other Topics Concern   Not on file  Social History Narrative  Certified Diabetes Educator -- working FT   Married   2 kids -- 57 yo and 67 yo   Social Drivers of Corporate investment banker Strain: Low Risk  (03/22/2023)   Overall Financial Resource Strain (CARDIA)    Difficulty of Paying Living Expenses: Not hard at all  Food Insecurity: No Food Insecurity (03/22/2023)   Hunger Vital Sign    Worried About Running Out of Food in the Last Year: Never true    Ran Out of Food in the Last Year: Never true  Transportation Needs: No Transportation Needs (03/22/2023)   PRAPARE - Administrator, Civil Service (Medical): No    Lack of Transportation (Non-Medical): No  Physical Activity: Insufficiently Active (03/22/2023)   Exercise Vital Sign    Days of Exercise per Week: 4 days    Minutes of Exercise per Session: 30 min  Stress: No  Stress Concern Present (03/22/2023)   [employer] of Occupational Health - Occupational Stress Questionnaire    Feeling of Stress : Only a little  Social Connections: Socially Integrated (03/22/2023)   Social Connection and Isolation Panel [NHANES]    Frequency of Communication with Friends and Family: More than three times a week    Frequency of Social Gatherings with Friends and Family: Three times a week    Attends Religious Services: More than 4 times per year    Active Member of Clubs or Organizations: Yes    Attends Engineer, structural: More than 4 times per year    Marital Status: Married  Catering manager Violence: Not on file    Review of Systems:  All other review of systems negative except as mentioned in the HPI.  Physical Exam: Vital signs BP (!) 159/84   Temp 98.6 F (37 C)   Ht 5\' 3"  (1.6 m)   Wt 120 lb (54.4 kg)   SpO2 98%   BMI 21.26 kg/m   General:   Alert,  Well-developed, well-nourished, pleasant and cooperative in NAD Airway:  Mallampati 1 Lungs:  Clear throughout to auscultation.   Heart:  Regular rate and rhythm; no murmurs, clicks, rubs,  or gallops. Abdomen:  Soft, nontender and nondistended. Normal bowel sounds.   Neuro/Psych:  Normal mood and affect. A and O x 3  Maren Beach, MD [facility]

## 2024-02-16 ENCOUNTER — Encounter: Payer: Self-pay | Admitting: Physician Assistant

## 2024-02-16 ENCOUNTER — Encounter: Payer: Self-pay | Admitting: Pediatrics

## 2024-02-16 MED ORDER — AMPHETAMINE-DEXTROAMPHET ER 20 MG PO CP24
20.0000 mg | ORAL_CAPSULE | ORAL | 0 refills | Status: DC
Start: 2024-02-18 — End: 2024-03-20

## 2024-02-16 NOTE — Telephone Encounter (Signed)
 Pt requesting refill for Adderall XR 20 mg capsule. Last OV 10/16/20204.

## 2024-02-18 ENCOUNTER — Encounter: Payer: Self-pay | Admitting: Pediatrics

## 2024-02-18 ENCOUNTER — Ambulatory Visit: Payer: 59 | Admitting: Pediatrics

## 2024-02-18 VITALS — BP 116/73 | HR 77 | Temp 98.6°F | Resp 11 | Ht 63.0 in | Wt 120.0 lb

## 2024-02-18 DIAGNOSIS — Z1211 Encounter for screening for malignant neoplasm of colon: Secondary | ICD-10-CM | POA: Diagnosis present

## 2024-02-18 DIAGNOSIS — K648 Other hemorrhoids: Secondary | ICD-10-CM | POA: Diagnosis not present

## 2024-02-18 MED ORDER — SODIUM CHLORIDE 0.9 % IV SOLN: 500.0000 mL | Freq: Once | INTRAVENOUS | Status: DC

## 2024-02-18 NOTE — Op Note (Signed)
 [facility] Patient Name: Angelica Lee Procedure Date: 02/18/2024 10:53 AM MRN: [id number] Endoscopist: Maren Beach , MD, [id number] Age: 48 Referring MD:  Date of Birth: 1976-12-14 Gender: Female Account #: [id number] Procedure:                Colonoscopy Indications:              Colon cancer screening in patient at increased                            risk: Family history of 1st-degree relative with                            colon polyps, Family history of colon cancer in                            paternal grandfather age 23, Last colonoscopy: 2011 Medicines:                Monitored Anesthesia Care Procedure:                Pre-Anesthesia Assessment:                           - Prior to the procedure, a History and Physical                            was performed, and patient medications and                            allergies were reviewed. The patient's tolerance of                            previous anesthesia was also reviewed. The risks                            and benefits of the procedure and the sedation                            options and risks were discussed with the patient.                            All questions were answered, and informed consent                            was obtained. Prior Anticoagulants: The patient has                            taken no anticoagulant or antiplatelet agents. ASA                            Grade Assessment: I - A normal, healthy patient.                            After reviewing the risks and benefits, the patient  was deemed in satisfactory condition to undergo the                            procedure.                           After obtaining informed consent, the colonoscope                            was passed under direct vision. Throughout the                            procedure, the patient's blood pressure, pulse, and                            oxygen saturations were  monitored continuously. The                            Olympus Scope SN: [id number] was introduced through                            the anus and advanced to the cecum, identified by                            appendiceal orifice and ileocecal valve. The                            colonoscopy was performed without difficulty. The                            patient tolerated the procedure well. The quality                            of the bowel preparation was good. The ileocecal                            valve, appendiceal orifice, and rectum were                            photographed. Scope In: 11:02:12 AM Scope Out: 11:17:44 AM Scope Withdrawal Time: 0 hours 9 minutes 53 seconds  Total Procedure Duration: 0 hours 15 minutes 32 seconds  Findings:                 The perianal and digital rectal examinations were                            normal. Pertinent negatives include normal                            sphincter tone and no palpable rectal lesions.                           The colon (entire examined portion) appeared normal.  Internal hemorrhoids were found during retroflexion. Complications:            No immediate complications. Estimated blood loss:                            None. Estimated Blood Loss:     Estimated blood loss: none. Impression:               - The entire examined colon is normal.                           - Internal hemorrhoids.                           - No specimens collected. Recommendation:           - Discharge patient to home (ambulatory).                           - Repeat colonoscopy in 10 years for screening                            purposes.                           - The findings and recommendations were discussed                            with the patient's family.                           - Return to referring physician.                           - Patient has a contact number available for                             emergencies. The signs and symptoms of potential                            delayed complications were discussed with the                            patient. Return to normal activities tomorrow.                            Written discharge instructions were provided to the                            patient. Maren Beach, MD 02/18/2024 11:22:34 AM This report has been signed electronically.

## 2024-02-18 NOTE — Patient Instructions (Signed)

## 2024-02-18 NOTE — Progress Notes (Signed)
 Pt's states no medical or surgical changes since previsit or office visit.

## 2024-02-21 ENCOUNTER — Telehealth: Payer: Self-pay

## 2024-02-21 NOTE — Telephone Encounter (Signed)
 No answer after follow up call. Voice message left.

## 2024-03-20 ENCOUNTER — Other Ambulatory Visit: Payer: Self-pay | Admitting: Physician Assistant

## 2024-03-20 MED ORDER — AMPHETAMINE-DEXTROAMPHET ER 20 MG PO CP24: 20.0000 mg | ORAL_CAPSULE | ORAL | 0 refills | Status: DC

## 2024-03-20 NOTE — Telephone Encounter (Signed)
 Pt requesting refill for Adderall XR 20 mg. Last OV 09/2023, pt scheduled 03/29/2024

## 2024-03-29 ENCOUNTER — Ambulatory Visit (INDEPENDENT_AMBULATORY_CARE_PROVIDER_SITE_OTHER): Payer: 59 | Admitting: Physician Assistant

## 2024-03-29 ENCOUNTER — Encounter: Payer: Self-pay | Admitting: Physician Assistant

## 2024-03-29 VITALS — BP 120/70 | HR 92 | Temp 98.6°F | Ht 63.0 in | Wt 124.0 lb

## 2024-03-29 DIAGNOSIS — F902 Attention-deficit hyperactivity disorder, combined type: Secondary | ICD-10-CM

## 2024-03-29 DIAGNOSIS — L659 Nonscarring hair loss, unspecified: Secondary | ICD-10-CM | POA: Diagnosis not present

## 2024-03-29 MED ORDER — AMPHETAMINE-DEXTROAMPHET ER 25 MG PO CP24: 25.0000 mg | ORAL_CAPSULE | ORAL | 0 refills | Status: DC

## 2024-03-29 NOTE — Patient Instructions (Addendum)
 -   Hair Loss Treatment:   - Apply compounded 8% minoxidil and finasteride solution from [facility] every morning   - Avoid applying to freshly washed hair   - Use every other day if irritation occurs   - Choose between Viviscal or Nutrafol supplements for hair growth

## 2024-03-29 NOTE — Progress Notes (Signed)
 Angelica Lee is a 48 y.o. female here for a follow up of a pre-existing problem.  History of Present Illness:   Chief Complaint  Patient presents with   ADHD    HPI  ADHD  Patient reports compliance and good tolerance of Adderall XR 20 mg daily.  She's unsure if she's been overwhelmed with life and work and therefore doesn't feel her current dose is providing much benefits. She's unsure if her increased symptoms are caused by ADHD or by pre-menopause.  Patient states that she often forgets what her children tell her few minutes after.  Previously, she would take an extra 5 mg around the afternoon when needed which seemed to get her through the day.  She is interested in a dose increase at this time. Denies any heart palpitation.   Hair Thinning  Patient complains of hair thinning that she believes happened suddenly.  She denies having heavy menstrual cycles. Patient doesn't take any iron supplements or her placebo pills due to side effects of constipation.  She has been using a hair growth shampoo with minimal changes.    Will recheck ferritin and thyroid levels today.  Denies any recent Covid or viral sickness.  Plans to follow up with her gyn soon.   Past Medical History:  Diagnosis Date   Amenorrhea      Social History   Tobacco Use   Smoking status: Never   Smokeless tobacco: Never  Vaping Use   Vaping status: Never Used  Substance Use Topics   Alcohol use: Yes    Alcohol/week: 1.0 standard drink of alcohol    Types: 1 Glasses of wine per week   Drug use: Never    Past Surgical History:  Procedure Laterality Date   DILATION AND CURETTAGE OF UTERUS  2008    Family History  Problem Relation Age of Onset   Hypertension Mother    Hyperlipidemia Mother    Hypertension Father    ADD / ADHD Father    Graves' disease Father    Diabetes type I Sister        she is twin    Breast cancer Maternal Grandmother 57   Colon cancer Paternal Grandfather 39        possible   Esophageal cancer Neg Hx    Rectal cancer Neg Hx    Stomach cancer Neg Hx     No Known Allergies  Current Medications:   Current Outpatient Medications:    amphetamine-dextroamphetamine (ADDERALL XR) 20 MG 24 hr capsule, Take 1 capsule (20 mg total) by mouth every morning., Disp: 30 capsule, Rfl: 0   cholecalciferol (VITAMIN D3) 25 MCG (1000 UT) tablet, Take 1,000 Units by mouth daily., Disp: , Rfl:    norethindrone-ethinyl estradiol-FE (HAILEY FE 1/20) 1-20 MG-MCG tablet, Take 1 tablet by mouth daily., Disp: 84 tablet, Rfl: 3   zinc gluconate 50 MG tablet, Take 50 mg by mouth daily., Disp: , Rfl:    Review of Systems:   Review of Systems  Cardiovascular:  Negative for palpitations.  Psychiatric/Behavioral:  The patient is nervous/anxious.     Vitals:   Vitals:   03/29/24 0828  BP: 120/70  Pulse: 92  Temp: 98.6 F (37 C)  TempSrc: Temporal  SpO2: 99%  Weight: 124 lb (56.2 kg)  Height: 5\' 3"  (1.6 m)     Body mass index is 21.97 kg/m.  Physical Exam:   Physical Exam Vitals and nursing note reviewed.  Constitutional:      General:  She is not in acute distress.    Appearance: She is well-developed. She is not ill-appearing or toxic-appearing.  Cardiovascular:     Rate and Rhythm: Normal rate and regular rhythm.     Pulses: Normal pulses.     Heart sounds: Normal heart sounds, S1 normal and S2 normal.  Pulmonary:     Effort: Pulmonary effort is normal.     Breath sounds: Normal breath sounds.  Skin:    General: Skin is warm and dry.  Neurological:     Mental Status: She is alert.     GCS: GCS eye subscore is 4. GCS verbal subscore is 5. GCS motor subscore is 6.  Psychiatric:        Speech: Speech normal.        Behavior: Behavior normal. Behavior is cooperative.     Assessment and Plan:   Attention deficit hyperactivity disorder (ADHD), combined type Stable but room for improvement Will send in Adderall XR 25 mg daily for her to try Message  after trying for a few days to update progress Follow up in 3 month(s), sooner if concerns  Hair thinning Recommend Viviscal or Nutrafol supplements for hair growth Recommend iron supplementation -- declines checking ferritin today Follow up in 3 month(s), sooner if concerns Consider referral to dermatology   Alexander Iba, PA-C  Scot Cutter Clovis Dar as a scribe for Alexander Iba, PA.,have documented all relevant documentation on the behalf of Alexander Iba, PA,as directed by  Alexander Iba, PA while in the presence of Alexander Iba, [place].   I, Alexander Iba, [place], have reviewed all documentation for this visit. The documentation on 03/29/24 for the exam, diagnosis, procedures, and orders are all accurate and complete.

## 2024-03-30 ENCOUNTER — Other Ambulatory Visit: Payer: Self-pay

## 2024-03-30 DIAGNOSIS — Z3041 Encounter for surveillance of contraceptive pills: Secondary | ICD-10-CM

## 2024-03-30 NOTE — Telephone Encounter (Signed)
 Med refill request: Angelica Lee 1/20 Last AEX: 04/05/24 Next AEX: 04/07/24 Last MMG (if hormonal med) n/a Refill authorized:  Angelica Lee 1/20 Please approve or deny as appropriate.

## 2024-04-04 MED ORDER — NORETHIN ACE-ETH ESTRAD-FE 1-20 MG-MCG PO TABS: 1.0000 | ORAL_TABLET | Freq: Every day | ORAL | 0 refills | Status: DC

## 2024-04-07 ENCOUNTER — Ambulatory Visit (INDEPENDENT_AMBULATORY_CARE_PROVIDER_SITE_OTHER): Payer: 59 | Admitting: Radiology

## 2024-04-07 ENCOUNTER — Encounter: Payer: Self-pay | Admitting: Radiology

## 2024-04-07 VITALS — BP 102/64 | HR 63 | Ht 63.5 in | Wt 120.5 lb

## 2024-04-07 DIAGNOSIS — L659 Nonscarring hair loss, unspecified: Secondary | ICD-10-CM | POA: Diagnosis not present

## 2024-04-07 DIAGNOSIS — Z1331 Encounter for screening for depression: Secondary | ICD-10-CM

## 2024-04-07 DIAGNOSIS — Z01419 Encounter for gynecological examination (general) (routine) without abnormal findings: Secondary | ICD-10-CM

## 2024-04-07 NOTE — Progress Notes (Signed)
 Angelica Lee Mar 05, 1976 [id number]   History:  48 y.o. G3P2 presents for annual exam.c/o hair thinning/loss, brain fog, joint pain and fatigue. Currently on OCPs. Using a hair growth shampoo with no results. Was encouraged to try  Nutrafol by PCP.   Gynecologic History Patient's last menstrual period was 03/27/2024 (exact date). Period Cycle (Days):  (skips periods with COC's) Period Duration (Days): 4 Period Pattern: Regular Menstrual Flow: Light Menstrual Control: Tampon Dysmenorrhea: None Contraception/Family planning: OCP (estrogen/progesterone) Sexually active: yes Last Pap: 2024. Results were: normal Last mammogram: 10/24. Results were: normal  Obstetric History OB History  Gravida Para Term Preterm AB Living  3 2 2  1 2   SAB IAB Ectopic Multiple Live Births  1    2    # Outcome Date GA Lbr Len/2nd Weight Sex Type Anes PTL Lv  3 Term     F Vag-Spont   LIV  2 Term     M Vag-Spont   LIV  1 SAB                04/07/2024    7:59 AM 09/29/2023    8:02 AM 09/22/2022   10:03 AM  Depression screen PHQ 2/9  Decreased Interest 0 0 0  Down, Depressed, Hopeless 0 0 0  PHQ - 2 Score 0 0 0     The following portions of the patient's history were reviewed and updated as appropriate: allergies, current medications, past family history, past medical history, past social history, past surgical history, and problem list.  Review of Systems  All other systems reviewed and are negative.   Past medical history, past surgical history, family history and social history were all reviewed and documented in the EPIC chart.  Exam:  Vitals:   04/07/24 0757  BP: 102/64  Pulse: 63  SpO2: 97%  Weight: 120 lb 8 oz (54.7 kg)  Height: 5' 3.5" (1.613 m)   Body mass index is 21.01 kg/m.  Physical Exam Vitals and nursing note reviewed. Exam conducted with a chaperone present.  Constitutional:      Appearance: Normal appearance. She is normal weight.  HENT:     Head: Normocephalic  and atraumatic.  Neck:     Thyroid: No thyroid mass, thyromegaly or thyroid tenderness.  Cardiovascular:     Rate and Rhythm: Regular rhythm.     Heart sounds: Normal heart sounds.  Pulmonary:     Effort: Pulmonary effort is normal.     Breath sounds: Normal breath sounds.  Chest:  Breasts:    Breasts are symmetrical.     Right: Normal. No inverted nipple, mass, nipple discharge, skin change or tenderness.     Left: Normal. No inverted nipple, mass, nipple discharge, skin change or tenderness.  Abdominal:     General: Abdomen is flat. Bowel sounds are normal.     Palpations: Abdomen is soft.  Genitourinary:    General: Normal vulva.     Vagina: Normal. No vaginal discharge, bleeding or lesions.     Cervix: Normal. No discharge or lesion.     Uterus: Normal. Not enlarged and not tender.      Adnexa: Right adnexa normal and left adnexa normal.       Right: No mass, tenderness or fullness.         Left: No mass, tenderness or fullness.    Lymphadenopathy:     Upper Body:     Right upper body: No axillary adenopathy.     Left upper  body: No axillary adenopathy.  Skin:    General: Skin is warm and dry.  Neurological:     Mental Status: She is alert and oriented to person, place, and time.  Psychiatric:        Mood and Affect: Mood normal.        Thought Content: Thought content normal.        Judgment: Judgment normal.      Ellis Guys, CMA present for exam  Assessment/Plan:   1. Well woman exam with routine gynecological exam (Primary) Pap 2027 Mammo yearly  2. Hair loss - Thyroid Panel With TSH - Iron, TIBC and Ferritin Panel   Will contact with results. Discussed the option of switching to HRT to manage symptoms. Will need to consider using condoms, phexxi or an IUD for pregnancy prevention. Will consider.  Laine Piggs WHNP-BC 8:47 AM 04/07/2024

## 2024-04-07 NOTE — Patient Instructions (Signed)
 Preventive Care 46-46 Years Old, Female  Preventive care refers to lifestyle choices and visits with your health care provider that can promote health and wellness. Preventive care visits are also called wellness exams.  What can I expect for my preventive care visit?  Counseling  Your health care provider may ask you questions about your:  Medical history, including:  Past medical problems.  Family medical history.  Pregnancy history.  Current health, including:  Menstrual cycle.  Method of birth control.  Emotional well-being.  Home life and relationship well-being.  Sexual activity and sexual health.  Lifestyle, including:  Alcohol, nicotine or tobacco, and drug use.  Access to firearms.  Diet, exercise, and sleep habits.  Work and work Astronomer.  Sunscreen use.  Safety issues such as seatbelt and bike helmet use.  Physical exam  Your health care provider will check your:  Height and weight. These may be used to calculate your BMI (body mass index). BMI is a measurement that tells if you are at a healthy weight.  Waist circumference. This measures the distance around your waistline. This measurement also tells if you are at a healthy weight and may help predict your risk of certain diseases, such as type 2 diabetes and high blood pressure.  Heart rate and blood pressure.  Body temperature.  Skin for abnormal spots.  What immunizations do I need?    Vaccines are usually given at various ages, according to a schedule. Your health care provider will recommend vaccines for you based on your age, medical history, and lifestyle or other factors, such as travel or where you work.  What tests do I need?  Screening  Your health care provider may recommend screening tests for certain conditions. This may include:  Lipid and cholesterol levels.  Diabetes screening. This is done by checking your blood sugar (glucose) after you have not eaten for a while (fasting).  Pelvic exam and Pap test.  Hepatitis B test.  Hepatitis C  test.  HIV (human immunodeficiency virus) test.  STI (sexually transmitted infection) testing, if you are at risk.  Lung cancer screening.  Colorectal cancer screening.  Mammogram. Talk with your health care provider about when you should start having regular mammograms. This may depend on whether you have a family history of breast cancer.  BRCA-related cancer screening. This may be done if you have a family history of breast, ovarian, tubal, or peritoneal cancers.  Bone density scan. This is done to screen for osteoporosis.  Talk with your health care provider about your test results, treatment options, and if necessary, the need for more tests.  Follow these instructions at home:  Eating and drinking    Eat a diet that includes fresh fruits and vegetables, whole grains, lean protein, and low-fat dairy products.  Take vitamin and mineral supplements as recommended by your health care provider.  Do not drink alcohol if:  Your health care provider tells you not to drink.  You are pregnant, may be pregnant, or are planning to become pregnant.  If you drink alcohol:  Limit how much you have to 0-1 drink a day.  Know how much alcohol is in your drink. In the [place] one drink equals one 12 oz bottle of beer (355 mL), one 5 oz glass of wine (148 mL), or one 1 oz glass of hard liquor (44 mL).  Lifestyle  Brush your teeth every morning and night with fluoride toothpaste. Floss one time each day.  Exercise for at least  30 minutes 5 or more days each week.  Do not use any products that contain nicotine or tobacco. These products include cigarettes, chewing tobacco, and vaping devices, such as e-cigarettes. If you need help quitting, ask your health care provider.  Do not use drugs.  If you are sexually active, practice safe sex. Use a condom or other form of protection to prevent STIs.  If you do not wish to become pregnant, use a form of birth control. If you plan to become pregnant, see your health care provider for a  prepregnancy visit.  Take aspirin only as told by your health care provider. Make sure that you understand how much to take and what form to take. Work with your health care provider to find out whether it is safe and beneficial for you to take aspirin daily.  Find healthy ways to manage stress, such as:  Meditation, yoga, or listening to music.  Journaling.  Talking to a trusted person.  Spending time with friends and family.  Minimize exposure to UV radiation to reduce your risk of skin cancer.  Safety  Always wear your seat belt while driving or riding in a vehicle.  Do not drive:  If you have been drinking alcohol. Do not ride with someone who has been drinking.  When you are tired or distracted.  While texting.  If you have been using any mind-altering substances or drugs.  Wear a helmet and other protective equipment during sports activities.  If you have firearms in your house, make sure you follow all gun safety procedures.  Seek help if you have been physically or sexually abused.  What's next?  Visit your health care provider once a year for an annual wellness visit.  Ask your health care provider how often you should have your eyes and teeth checked.  Stay up to date on all vaccines.  This information is not intended to replace advice given to you by your health care provider. Make sure you discuss any questions you have with your health care provider.  Document Revised: 05/28/2021 Document Reviewed: 05/28/2021  Elsevier Patient Education  2024 [employer].

## 2024-04-08 LAB — THYROID PANEL WITH TSH
Free Thyroxine Index: 2.2 (ref 1.4–3.8)
T3 Uptake: 26 % (ref 22–35)
T4, Total: 8.4 ug/dL (ref 5.1–11.9)
TSH: 1.66 m[IU]/L

## 2024-04-08 LAB — IRON,TIBC AND FERRITIN PANEL
%SAT: 23 % (ref 16–45)
Ferritin: 67 ng/mL (ref 16–232)
Iron: 80 ug/dL (ref 40–190)
TIBC: 352 ug/dL (ref 250–450)

## 2024-04-11 ENCOUNTER — Other Ambulatory Visit: Payer: Self-pay

## 2024-04-11 DIAGNOSIS — Z975 Presence of (intrauterine) contraceptive device: Secondary | ICD-10-CM

## 2024-04-11 MED ORDER — MISOPROSTOL 200 MCG PO TABS: 400.0000 ug | ORAL_TABLET | Freq: Once | ORAL | 0 refills | Status: AC

## 2024-04-11 NOTE — Telephone Encounter (Signed)
 Please schedule Mirena insertion. Continue OCPs until insert. Cytotec 400mcg the night before motrin 800mg  2 hours before appt with food.

## 2024-04-19 NOTE — Telephone Encounter (Signed)
 Please have her wait until insertion to start the patch so she has endometrial protection. Continue OCPs until then.

## 2024-04-20 ENCOUNTER — Other Ambulatory Visit: Payer: Self-pay

## 2024-04-20 DIAGNOSIS — Z3041 Encounter for surveillance of contraceptive pills: Secondary | ICD-10-CM

## 2024-04-20 MED ORDER — NORETHIN ACE-ETH ESTRAD-FE 1-20 MG-MCG PO TABS: 1.0000 | ORAL_TABLET | Freq: Every day | ORAL | 0 refills | Status: DC

## 2024-04-20 NOTE — Telephone Encounter (Signed)
 Medication refill request: hailey fe  Last AEX:  04/07/24  Next AEX: 06/14/24 IUD insertion  Last MMG (if hormonal medication request): n/a Refill authorized: patient request enough OCP to get to her IUD insertions.

## 2024-05-03 ENCOUNTER — Encounter: Payer: Self-pay | Admitting: Physician Assistant

## 2024-05-04 ENCOUNTER — Other Ambulatory Visit: Payer: Self-pay | Admitting: Physician Assistant

## 2024-05-04 MED ORDER — AMPHETAMINE-DEXTROAMPHET ER 25 MG PO CP24: 25.0000 mg | ORAL_CAPSULE | ORAL | 0 refills | Status: DC

## 2024-05-04 MED ORDER — AMPHETAMINE-DEXTROAMPHET ER 25 MG PO CP24
25.0000 mg | ORAL_CAPSULE | ORAL | 0 refills | Status: DC
Start: 2024-06-03 — End: 2024-06-21

## 2024-06-05 ENCOUNTER — Encounter: Payer: Self-pay | Admitting: Physician Assistant

## 2024-06-14 ENCOUNTER — Ambulatory Visit: Admitting: Radiology

## 2024-06-18 ENCOUNTER — Other Ambulatory Visit: Payer: Self-pay | Admitting: Medical Genetics

## 2024-06-21 ENCOUNTER — Ambulatory Visit (INDEPENDENT_AMBULATORY_CARE_PROVIDER_SITE_OTHER): Admitting: Physician Assistant

## 2024-06-21 ENCOUNTER — Encounter: Payer: Self-pay | Admitting: Physician Assistant

## 2024-06-21 VITALS — BP 118/78 | HR 99 | Temp 98.2°F | Ht 63.5 in | Wt 120.4 lb

## 2024-06-21 DIAGNOSIS — L659 Nonscarring hair loss, unspecified: Secondary | ICD-10-CM | POA: Diagnosis not present

## 2024-06-21 DIAGNOSIS — F902 Attention-deficit hyperactivity disorder, combined type: Secondary | ICD-10-CM

## 2024-06-21 MED ORDER — AMPHETAMINE-DEXTROAMPHET ER 25 MG PO CP24: 25.0000 mg | ORAL_CAPSULE | ORAL | 0 refills | Status: DC

## 2024-06-21 NOTE — Progress Notes (Signed)
 Angelica Lee is a 48 y.o. female here for a follow up of a pre-existing problem.  History of Present Illness:   Chief Complaint  Patient presents with   ADHD    HPI  ADHD  Pt is on Adderall XR 25 mg daily. Good compliance and tolerance. ADHD is well-managed. No acute concerns reported today.  Hair thinning  Pt has been taking Nutrafol once daily, but reports heartburn while on it. She has tried taking with food and water, but it has not helped the side effects. She states she is going to try Viviscal after finishing Nutrafol.    Past Medical History:  Diagnosis Date   Amenorrhea      Social History   Tobacco Use   Smoking status: Never    Passive exposure: Never   Smokeless tobacco: Never  Vaping Use   Vaping status: Never Used  Substance Use Topics   Alcohol use: Yes    Alcohol/week: 1.0 standard drink of alcohol    Types: 1 Glasses of wine per week    Comment: rare   Drug use: Never    Past Surgical History:  Procedure Laterality Date   DILATION AND CURETTAGE OF UTERUS  2008    Family History  Problem Relation Age of Onset   Hypertension Mother    Hyperlipidemia Mother    Hypertension Father    ADD / ADHD Father    Yvone' disease Father        eye disorder from thyroid  disorder   Diabetes type I Sister        she is twin    Other Sister        frozen shoulder   Breast cancer Maternal Grandmother 58   Colon cancer Paternal Grandfather 69       possible   Esophageal cancer Neg Hx    Rectal cancer Neg Hx    Stomach cancer Neg Hx     No Known Allergies  Current Medications:   Current Outpatient Medications:    cholecalciferol (VITAMIN D3) 25 MCG (1000 UT) tablet, Take 1,000 Units by mouth daily. (Patient taking differently: Take 2,000 Units by mouth daily.), Disp: , Rfl:    FINASTERIDE-MINOXIDIL EX, Apply 4 sprays topically daily in the afternoon. Finasteride 0.3%, Minoxidil 6%, Disp: , Rfl:    norethindrone-ethinyl estradiol-FE (HAILEY FE  1/20) 1-20 MG-MCG tablet, Take 1 tablet by mouth daily., Disp: 84 tablet, Rfl: 0   OVER THE COUNTER MEDICATION, Take 4 capsules by mouth daily in the afternoon. Magnesium L-Threonate 2,000 mg, Disp: , Rfl:    OVER THE COUNTER MEDICATION, Take 1 capsule by mouth daily in the afternoon. Nutrafol, Disp: , Rfl:    amphetamine -dextroamphetamine (ADDERALL XR) 25 MG 24 hr capsule, Take 1 capsule by mouth every morning., Disp: 30 capsule, Rfl: 0   [START ON 07/21/2024] amphetamine -dextroamphetamine (ADDERALL XR) 25 MG 24 hr capsule, Take 1 capsule by mouth every morning., Disp: 30 capsule, Rfl: 0   [START ON 08/20/2024] amphetamine -dextroamphetamine (ADDERALL XR) 25 MG 24 hr capsule, Take 1 capsule by mouth every morning., Disp: 30 capsule, Rfl: 0   misoprostol  (CYTOTEC ) 200 MCG tablet, Place 2 tablets (400 mcg total) vaginally once for 1 dose. (Patient not taking: Reported on 06/21/2024), Disp: 2 tablet, Rfl: 0   zinc gluconate 50 MG tablet, Take 50 mg by mouth daily., Disp: , Rfl:    Review of Systems:   Negative unless otherwise specified per HPI.  Vitals:   Vitals:   06/21/24 0857  BP: 118/78  Pulse: 99  Temp: 98.2 F (36.8 C)  TempSrc: Temporal  SpO2: 99%  Weight: 120 lb 6.1 oz (54.6 kg)  Height: 5' 3.5 (1.613 m)     Body mass index is 20.99 kg/m.  Physical Exam:   Physical Exam Constitutional:      Appearance: Normal appearance. She is well-developed.  HENT:     Head: Normocephalic and atraumatic.  Eyes:     General: Lids are normal.     Extraocular Movements: Extraocular movements intact.     Conjunctiva/sclera: Conjunctivae normal.  Pulmonary:     Effort: Pulmonary effort is normal.  Musculoskeletal:        General: Normal range of motion.     Cervical back: Normal range of motion and neck supple.  Skin:    General: Skin is warm and dry.  Neurological:     Mental Status: She is alert and oriented to person, place, and time.  Psychiatric:        Attention and Perception:  Attention and perception normal.        Mood and Affect: Mood normal.        Behavior: Behavior normal.        Thought Content: Thought content normal.        Judgment: Judgment normal.     Assessment and Plan:   1. Attention deficit hyperactivity disorder (ADHD), combined type (Primary)  Well controlled No red flags on prescription drug monitoring program Continue Adderall XR 25 mg daily Follow up in 3 month(s), sooner if concerns  2. Hair thinning   Continue current supplements as tolerated Follow up as needed Consider dermatology referral   I, Lavern Simmers, acting as a Neurosurgeon for [employer], [place]., have documented all relevant documentation on the behalf of Lucie Buttner, [place], as directed by Lucie Buttner, PA while in the presence of Lucie Buttner, [place].  I, Lucie Buttner, [place], have reviewed all documentation for this visit. The documentation on 06/21/24 for the exam, diagnosis, procedures, and orders are all accurate and complete.  Lucie Buttner, PA-C

## 2024-06-28 ENCOUNTER — Ambulatory Visit: Admitting: Physician Assistant

## 2024-07-19 DIAGNOSIS — Z3041 Encounter for surveillance of contraceptive pills: Secondary | ICD-10-CM

## 2024-07-19 NOTE — Telephone Encounter (Signed)
 Med refill request:Angelica Lee Last AEX: 04/07/24 -JC Next AEX: Not scheduled, MyChart message sent  Last MMG (if hormonal med) 10/11/23;BiRads 1 neg Refill authorized: Please Advise?

## 2024-07-20 MED ORDER — NORETHIN ACE-ETH ESTRAD-FE 1-20 MG-MCG PO TABS: 1.0000 | ORAL_TABLET | Freq: Every day | ORAL | 2 refills | Status: AC

## 2024-07-24 ENCOUNTER — Other Ambulatory Visit: Payer: Self-pay | Admitting: Physician Assistant

## 2024-07-24 ENCOUNTER — Encounter: Payer: Self-pay | Admitting: Physician Assistant

## 2024-07-24 MED ORDER — AMPHETAMINE-DEXTROAMPHET ER 25 MG PO CP24: 25.0000 mg | ORAL_CAPSULE | ORAL | 0 refills | Status: AC

## 2024-10-15 ENCOUNTER — Other Ambulatory Visit: Payer: Self-pay | Admitting: Medical Genetics

## 2024-10-15 DIAGNOSIS — Z006 Encounter for examination for normal comparison and control in clinical research program: Secondary | ICD-10-CM
